# Patient Record
Sex: Male | Born: 1990 | Race: Black or African American | Hispanic: No | Marital: Single | Smoking: Current every day smoker
Health system: Southern US, Community
[De-identification: ages and names within clinical notes are randomized; demographics above are authoritative.]

---
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
## 2013-01-27 ENCOUNTER — Encounter (HOSPITAL_COMMUNITY): Payer: Self-pay

## 2013-01-27 ENCOUNTER — Emergency Department (HOSPITAL_COMMUNITY)
Admission: EM | Admit: 2013-01-27 | Discharge: 2013-01-28 | Disposition: A | Discharge status: Home / Self Care | Payer: BC Managed Care – PPO | Attending: Emergency Medicine | Admitting: Emergency Medicine

## 2013-01-27 DIAGNOSIS — R112 Nausea with vomiting, unspecified: Secondary | ICD-10-CM | POA: Insufficient documentation

## 2013-01-27 DIAGNOSIS — Z87891 Personal history of nicotine dependence: Secondary | ICD-10-CM | POA: Insufficient documentation

## 2013-01-27 NOTE — ED Notes (Addendum)
Pt hasn't eat for 2 days due to lack of money to buy food, EMS CBG was 57 with weakness, shaky, finger numbness, and couldn't stand up.  No medical history, non-diabetic. EMS gave D50 and IM glucogon, CBG now 183, pt appeared more responsive now. Alert, oriented. Vital sign stable. Sinus rhythm on monitor.

## 2013-01-27 NOTE — ED Notes (Signed)
Gave pt. Sandwich, cheese, and apple sauce

## 2013-01-27 NOTE — ED Notes (Signed)
Bed:WA02<BR> Expected date:<BR> Expected time:<BR> Means of arrival:<BR> Comments:<BR> ems

## 2013-01-28 LAB — GLUCOSE, CAPILLARY: Glucose-Capillary: 183 mg/dL — ABNORMAL HIGH (ref 70–99)

## 2013-01-28 MED ORDER — SODIUM CHLORIDE 0.9 % IV BOLUS (SEPSIS)
1000.0000 mL | Freq: Once | INTRAVENOUS | Status: AC
  Administered 2013-01-28: 1000 mL via INTRAVENOUS

## 2013-01-28 MED ORDER — ONDANSETRON HCL 4 MG/2ML IJ SOLN
INTRAMUSCULAR | Status: AC
  Administered 2013-01-28: 4 mg via INTRAVENOUS
  Filled 2013-01-28: qty 2

## 2013-01-28 MED ORDER — ONDANSETRON HCL 4 MG/2ML IJ SOLN
4.0000 mg | Freq: Once | INTRAMUSCULAR | Status: AC
  Administered 2013-01-28: 4 mg via INTRAVENOUS

## 2013-01-28 MED ORDER — METOCLOPRAMIDE HCL 5 MG/ML IJ SOLN
10.0000 mg | Freq: Once | INTRAMUSCULAR | Status: AC
  Administered 2013-01-28: 10 mg via INTRAVENOUS
  Filled 2013-01-28: qty 2

## 2013-01-28 NOTE — ED Provider Notes (Signed)
History     CSN: [id number]  Arrival date & time 01/27/13  2318   First MD Initiated Contact with Patient 01/27/13 2329      Chief Complaint  Patient presents with  . Hypoglycemia    (Consider location/radiation/quality/duration/timing/severity/associated sxs/prior treatment) HPI Comments: Patient states he has not eaten in -3 day because he has no money to but food But did attend a super bowl party and drink ETOH present to the ED with N/V   The history is provided by the patient.    No past medical history on file.  Past Surgical History  Procedure Date  . Esophagogastroduodenoscopy 11/15/2011    Procedure: ESOPHAGOGASTRODUODENOSCOPY (EGD);  Surgeon: Rob Bunting, MD;  Location: [facility] OR;  Service: Gastroenterology;  Laterality: N/A;    No family history on file.  History  Substance Use Topics  . Smoking status: Former Games developer  . Smokeless tobacco: Not on file  . Alcohol Use: Yes      Review of Systems  Constitutional: Negative for fever and chills.  Respiratory: Negative for shortness of breath.   Cardiovascular: Negative for chest pain.  Gastrointestinal: Positive for nausea and vomiting. Negative for abdominal pain and diarrhea.  Skin: Negative for rash and wound.  Neurological: Negative for dizziness and weakness.    Allergies  Review of patient's allergies indicates no known allergies.  Home Medications  No current outpatient prescriptions on file.  BP 125/73  Pulse 75  Temp 97.6 F (36.4 C) (Oral)  Resp 21  SpO2 100%  Physical Exam  Constitutional: He appears well-developed and well-nourished.  HENT:  Head: Normocephalic.  Eyes: Pupils are equal, round, and reactive to light.  Neck: Normal range of motion.  Cardiovascular: Normal rate.   Pulmonary/Chest: Effort normal.  Abdominal: Soft. Bowel sounds are normal. He exhibits no distension. There is no tenderness.  Musculoskeletal: Normal range of motion.  Neurological: He is alert.  Skin: Skin  is warm and dry.    ED Course  Procedures (including critical care time)  Labs Reviewed - No data to display No results found.   1. Nausea & vomiting       MDM   Patient in no acute distress has been give 8 mg of Zofran nut still nauseated  Given Reglan and additional IV fluid         Arman Filter, NP 01/28/13 [phone]

## 2013-01-28 NOTE — ED Notes (Signed)
[id number]<YN> Expected date:<BR> Expected time:<BR> Means of arrival:<BR> Comments:<BR> Hold for room 2

## 2013-01-28 NOTE — ED Notes (Signed)
PO trail. Water given.

## 2013-01-28 NOTE — ED Provider Notes (Signed)
Medical screening examination/treatment/procedure(s) were performed by non-physician practitioner and as supervising physician I was immediately available for consultation/collaboration.   Anelise Staron M Bethel Gaglio, DO 01/28/13 1807 

## 2013-01-28 NOTE — ED Notes (Signed)
Pt tolerated water well. Crackers given at this time

## 2013-07-12 ENCOUNTER — Encounter (HOSPITAL_COMMUNITY): Payer: Self-pay

## 2013-07-12 ENCOUNTER — Emergency Department (HOSPITAL_COMMUNITY): Payer: BC Managed Care – PPO

## 2013-07-12 ENCOUNTER — Emergency Department (HOSPITAL_COMMUNITY)
Admission: EM | Admit: 2013-07-12 | Discharge: 2013-07-12 | Disposition: A | Discharge status: Home / Self Care | Payer: BC Managed Care – PPO | Attending: Emergency Medicine | Admitting: Emergency Medicine

## 2013-07-12 DIAGNOSIS — Z87891 Personal history of nicotine dependence: Secondary | ICD-10-CM | POA: Insufficient documentation

## 2013-07-12 DIAGNOSIS — W230XXA Caught, crushed, jammed, or pinched between moving objects, initial encounter: Secondary | ICD-10-CM | POA: Insufficient documentation

## 2013-07-12 DIAGNOSIS — Y939 Activity, unspecified: Secondary | ICD-10-CM | POA: Insufficient documentation

## 2013-07-12 DIAGNOSIS — Y990 Civilian activity done for income or pay: Secondary | ICD-10-CM | POA: Insufficient documentation

## 2013-07-12 DIAGNOSIS — S8990XA Unspecified injury of unspecified lower leg, initial encounter: Secondary | ICD-10-CM | POA: Insufficient documentation

## 2013-07-12 DIAGNOSIS — M79672 Pain in left foot: Secondary | ICD-10-CM

## 2013-07-12 DIAGNOSIS — Y929 Unspecified place or not applicable: Secondary | ICD-10-CM | POA: Insufficient documentation

## 2013-07-12 MED ORDER — HYDROCODONE-ACETAMINOPHEN 5-325 MG PO TABS: 1.0000 | ORAL_TABLET | ORAL | Status: DC | PRN

## 2013-07-12 MED ORDER — HYDROCODONE-ACETAMINOPHEN 5-325 MG PO TABS
2.0000 | ORAL_TABLET | Freq: Once | ORAL | Status: AC
  Administered 2013-07-12: 2 via ORAL
  Filled 2013-07-12: qty 2

## 2013-07-12 NOTE — ED Notes (Signed)
Pt got left big toe caught in hydraulic lift at work.

## 2013-07-12 NOTE — ED Provider Notes (Signed)
Medical screening examination/treatment/procedure(s) were performed by non-physician practitioner and as supervising physician I was immediately available for consultation/collaboration.  Schuyler Behan K Oney Tatlock-Rasch, MD 07/12/13 2343 

## 2013-07-12 NOTE — ED Provider Notes (Signed)
   History    CSN: [id number] Arrival date & time 07/12/13  0202  First MD Initiated Contact with Patient 07/12/13 0244     Chief Complaint  Patient presents with  . Toe Injury   (Consider location/radiation/quality/duration/timing/severity/associated sxs/prior Treatment) HPI History provided by pt.   Pt reports that his let foot was caught in a hydraulic lift at [employer] at [employer] yesterday, and he has had gradually worsening pain in right great toe and 2nd toe ever since.  Unable to bear weight.  Has not taken anything for pain.  No associated skin changes or paresthesias.  History reviewed. No pertinent past medical history. Past Surgical History  Procedure Laterality Date  . Esophagogastroduodenoscopy  11/15/2011    Procedure: ESOPHAGOGASTRODUODENOSCOPY (EGD);  Surgeon: Rob Bunting, MD;  Location: [facility] OR;  Service: Gastroenterology;  Laterality: N/A;   History reviewed. No pertinent family history. History  Substance Use Topics  . Smoking status: Former Games developer  . Smokeless tobacco: Not on file  . Alcohol Use: Yes     Comment: social    Review of Systems  All other systems reviewed and are negative.    Allergies  Review of patient's allergies indicates no known allergies.  Home Medications   Current Outpatient Rx  Name  Route  Sig  Dispense  Refill  . HYDROcodone-acetaminophen (NORCO/VICODIN) 5-325 MG per tablet   Oral   Take 1 tablet by mouth every 4 (four) hours as needed for pain.   15 tablet   0    BP 134/77  Pulse 77  Temp(Src) 98.2 F (36.8 C) (Oral)  Resp 18  SpO2 100% Physical Exam  Nursing note and vitals reviewed. Constitutional: He is oriented to person, place, and time. He appears well-developed and well-nourished. No distress.  HENT:  Head: Normocephalic and atraumatic.  Eyes:  Normal appearance  Neck: Normal range of motion.  Pulmonary/Chest: Effort normal.  Musculoskeletal: Normal range of motion.  L foot w/out deformity, edema, ecchymosis  or abrasion.  Severe tenderness base of 1st metacarpal and diffuse great and second toes.  Pain w/ minimal passive flexion joints of these two toes.  Distal sensation intact.    Neurological: He is alert and oriented to person, place, and time.  Psychiatric: He has a normal mood and affect. His behavior is normal.    ED Course  Procedures (including critical care time) Labs Reviewed - No data to display Dg Foot Complete Left  07/12/2013   *RADIOLOGY REPORT*  Clinical Data: Crash injury to the great toe.  LEFT FOOT - COMPLETE 3+ VIEW  Comparison: None.  Findings: The lateral great toe sesamoid has two parts with a mildly irregular margin in between.  No malalignment.  No radiodense foreign body.  IMPRESSION:  1.  Two-part lateral great toe sesamoid, often developmental, but occasionally post traumatic.  Correlate with point tenderness. 2.  Otherwise, negative left foot.   Original Report Authenticated By: Tiburcio Pea   1. Pain in left foot     MDM  Healthy Irena Reichmann M presents w/ right foot injury.  Xray shows sesamoid bones of great toe that could potentially be post-traumatic.  Images shared w/ patient. There is point tenderness in this location.  Cam walker provided by nursing staff and 2 vicodin ordered for pain.  D/c'd home w/ same + referral to ortho.  Recommended RICE at home as well.  Return precautions discussed.   Otilio Miu, PA-C 07/12/13 [phone]

## 2015-05-19 ENCOUNTER — Emergency Department (HOSPITAL_COMMUNITY): Payer: BLUE CROSS/BLUE SHIELD

## 2015-05-19 ENCOUNTER — Encounter (HOSPITAL_COMMUNITY): Payer: Self-pay | Admitting: Emergency Medicine

## 2015-05-19 ENCOUNTER — Emergency Department (HOSPITAL_COMMUNITY)
Admission: EM | Admit: 2015-05-19 | Discharge: 2015-05-19 | Disposition: A | Discharge status: Home / Self Care | Payer: BLUE CROSS/BLUE SHIELD | Attending: Emergency Medicine | Admitting: Emergency Medicine

## 2015-05-19 DIAGNOSIS — Y998 Other external cause status: Secondary | ICD-10-CM | POA: Diagnosis not present

## 2015-05-19 DIAGNOSIS — S62316A Displaced fracture of base of fifth metacarpal bone, right hand, initial encounter for closed fracture: Secondary | ICD-10-CM | POA: Insufficient documentation

## 2015-05-19 DIAGNOSIS — Y9389 Activity, other specified: Secondary | ICD-10-CM | POA: Insufficient documentation

## 2015-05-19 DIAGNOSIS — Y9289 Other specified places as the place of occurrence of the external cause: Secondary | ICD-10-CM | POA: Diagnosis not present

## 2015-05-19 DIAGNOSIS — W2201XA Walked into wall, initial encounter: Secondary | ICD-10-CM | POA: Insufficient documentation

## 2015-05-19 DIAGNOSIS — S62306A Unspecified fracture of fifth metacarpal bone, right hand, initial encounter for closed fracture: Secondary | ICD-10-CM

## 2015-05-19 DIAGNOSIS — Z87891 Personal history of nicotine dependence: Secondary | ICD-10-CM | POA: Diagnosis not present

## 2015-05-19 DIAGNOSIS — S6991XA Unspecified injury of right wrist, hand and finger(s), initial encounter: Secondary | ICD-10-CM | POA: Diagnosis present

## 2015-05-19 MED ORDER — NAPROXEN 375 MG PO TABS: 375.0000 mg | ORAL_TABLET | Freq: Two times a day (BID) | ORAL | Status: DC

## 2015-05-19 MED ORDER — TETANUS-DIPHTH-ACELL PERTUSSIS 5-2.5-18.5 LF-MCG/0.5 IM SUSP
0.5000 mL | Freq: Once | INTRAMUSCULAR | Status: DC
  Filled 2015-05-19: qty 0.5

## 2015-05-19 MED ORDER — KETOROLAC TROMETHAMINE 60 MG/2ML IM SOLN
60.0000 mg | Freq: Once | INTRAMUSCULAR | Status: AC
Start: 2015-05-19 — End: 2015-05-19
  Administered 2015-05-19: 60 mg via INTRAMUSCULAR
  Filled 2015-05-19: qty 2

## 2015-05-19 MED ORDER — HYDROCODONE-ACETAMINOPHEN 5-325 MG PO TABS: 1.0000 | ORAL_TABLET | Freq: Four times a day (QID) | ORAL | Status: DC | PRN

## 2015-05-19 NOTE — ED Notes (Signed)
Wound care completed on exposed fingers. Applied gauze.

## 2015-05-19 NOTE — ED Notes (Signed)
Patient here with complaint of right hand pain secondary to punching a wall. Right hand movement intact, swelling in lateral aspect of hand extending into wrist.

## 2015-05-19 NOTE — Discharge Instructions (Signed)
Boxer's Fracture Boxer's fracture is a broken bone (fracture) of the fourth or fifth metacarpal (ring or pinky finger). The metacarpal bones connect the wrist to the fingers and make up the arch of the hand. Boxer's fracture occurs toward the body (proximal) from the first knuckle. This injury is known as a boxer's fracture, because it often occurs from hitting an object with a closed fist. SYMPTOMS   Severe pain at the time of injury.  Pain and swelling around the first knuckle on the fourth or fifth finger.  Bruising (contusion) in the area within 48 hours of injury.  Visible deformity, such as a pushed down knuckle. This can occur if the fracture is complete, and the bone fragments separate enough to distort normal body shape.  Numbness or paralysis from swelling in the hand, causing pressure on the blood vessels or nerves (uncommon). CAUSES   Direct injury (trauma), such as a striking blow with the fist.  Indirect stress to the hand, such as twisting or violent muscle contraction (uncommon). RISK INCREASES WITH:  Punching an object with an unprotected knuckle.  Contact sports (football, rugby).  Sports that require hitting (boxing, martial arts).  History of bone or joint disease (osteoporosis). PREVENTION  Maintain physical fitness:  Muscle strength and flexibility.  Endurance.  Cardiovascular fitness.  For participation in contact sports, wear proper protective equipment for the hand and make sure it fits properly.  Learn and use proper technique when hitting or punching. PROGNOSIS  When proper treatment is given, to ensure normal alignment of the bones, healing can usually be expected in 4 to 6 weeks. Occasionally, surgery is necessary.  RELATED COMPLICATIONS   Bone does not heal back together (nonunion).  Bone heals together in an improper position (malunion), causing twisting of the finger when making a fist.  Chronic pain, stiffness, or swelling of the  hand.  Excessive bleeding in the hand, causing pressure and injury to nerves and blood vessels (rare).  Stopping of normal hand growth in children.  Infection of the wound, if skin is broken over the fracture (open fracture), or at the incision site if surgery is performed.  Shortening of injured bones.  Bony bump (prominence) in the palm or loss of shape of the knuckles.  Pain and weakness when gripping.  Arthritis of the affected joint, if the fracture goes into the joint, after repeated injury, or after delayed treatment.  Scarring around the knuckle, and limited motion. TREATMENT  Treatment varies, depending on the injury. The place in the hand where the injury occurs has a great deal of motion, which allows the hand to move properly. If the fracture is not aligned properly, this function may be decreased. If the bone ends are in proper alignment, treatment first involves ice and elevation of the injured hand, at or above heart level, to reduce inflammation. Pain medicines help to relieve pain. Treatment also involves restraint by splinting, bandaging, casting, or bracing for 4 or more weeks.  If the fracture is out of alignment (displaced), or it involves the joint, surgery is usually recommended. Surgery typically involves cutting through the skin to place removable pins, screws, and sometimes plates over the fracture. After surgery, the bone and joint are restrained for 4 or more weeks. After restraint (with or without surgery), stretching and strengthening exercises are needed to regain proper strength and function of the joint. Exercises may be done at home or with the assistance of a therapist. Depending on the sport and position played, a  brace or splint may be recommended when first returning to sports.  MEDICATION   If pain medication is necessary, nonsteroidal anti-inflammatory medications, such as aspirin and ibuprofen, or other minor pain relievers, such as acetaminophen, are  often recommended.  Do not take pain medication for 7 days before surgery.  Prescription pain relievers may be necessary. Use only as directed and only as much as you need. COLD THERAPY Cold treatment (icing) relieves pain and reduces inflammation. Cold treatment should be applied for 10 to 15 minutes every 2 to 3 hours for inflammation and pain, and immediately after any activity that aggravates your symptoms. Use ice packs or an ice massage. SEEK MEDICAL CARE IF:   Pain, tenderness, or swelling gets worse, despite treatment.  You experience pain, numbness, or coldness in the hand.  Blue, gray, or dark color appears in the fingernails.  You develop signs of infection, after surgery (fever, increased pain, swelling, redness, drainage of fluids, or bleeding in the surgical area).  You feel you have reinjured the hand.  New, unexplained symptoms develop. (Drugs used in treatment may produce side effects.) Document Released: 12/12/2005 Document Revised: 03/05/2012 Document Reviewed: 03/26/2009 [facility] Center[facility] Patient Information 2015 [place]ExitCare, [place]. This information is not intended to replace advice given to you by your health care provider. Make sure you discuss any questions you have with your health care provider.  Cast Care The purpose of a cast is to protect and immobilize an injured part of the body. This may be necessary after fractures, surgery, or other injuries. Splints are another form of immobilization; however, they are usually not as rigid as a cast, which accommodates swelling of the injury while still maintaining immobilization. Splints are typically used in the immediate post injury or postoperative period, before changing to a cast.  Before the rigid material of a cast is formed, a layer of padding is first applied to protect the injury. The rigid portion of a cast is created by wrapping gauze saturated with plaster of par[place] around the injury; alternatively, the cast may be made of  fiberglass. During the period of immobilization, a cast may need to be changed multiple times. The length of immobilization is dependent on the severity of the injury and the time needed for healing. This period can vary from a couple weeks to many months. After a cast is created, radiographs (X-rays) through the cast determine if a satisfactory alignment of the bones was achieved. Radiographs may also be used throughout the healing period to check for signs of bone healing.  CARE OF THE CAST   Allow the cast to dry and harden completely before applying any pressure to it.  Applying pressure too early may create a point of excessive pressure on the skin, which may increase the risk of forming an ulcer. Drying time depends on the type of cast but may last as long as 24 hours.  When a cast gets wet, a soft area may appear. If this happens accidentally, return to the doctor's office, emergency room, or outpatient surgical facility as soon as possible for repairs or changing of the cast.  Do not get sand in the cast.  Do not place anything inside the cast. This includes items intended to scratch an area of skin that itches. ITCHING INSIDE A CAST   It is very common for a person with a cast to experience an itching sensation under the cast.  Do not scratch any area under the cast even if the area is within  reach. The skin under a cast in an environment of increased risk for injury. °· Do not put anything in the cast. °· If there is no wound, such as an incision from surgery, you may sprinkle cornstarch into the cast to relieve itching. °· If a wound is present under the cast, consult your caregiver for pain medication or medication to reduce itching. °· Using a hairdryer (on the cold setting) is a useful technique for reducing an itching sensation. °CARE OF THE PATIENT IN A CAST °It is important to elevate the body part that is in a cast to a level equal to or above that of the heart whenever possible.  Elevating the injured body part may reduce the likelihood of swelling. Elevation of a leg in a cast may be achieved by resting the leg on a pillow when in bed and on a footstool or chair when sitting. For an arm in a cast, rest the arm on a pillow placed on the chest.  °No matter how well one follows the necessary precautions, excessive swelling may occur under the cast. Signs and symptoms of excessive swelling include: °· Severe and persistent pain. °· Change in color of the tissues beyond the end of the cast, such as a change to blue or gray under the nails of the fingers or toes. °· Coldness of the tissues beyond the cast when the rest of the body is warm. °· Numbness or complete loss of feeling in the skin beyond the cast. °· Feeling of tightness under the cast after it dries. °· Swelling of the tissue to a greater extent than was present before the cast was applied. °· For a leg cast, inability to raise the big toe. °If any of these signs or symptoms occur, contact your caregiver or an emergency room as soon as possible for treatment.  °Infection Inside a Cast °On occasion, an injury may become infected during healing. The most important way to fight an infection is to detect it early; however, early detection may be difficult if the infected area is covered by a cast. Infection should be reported immediately to your caregiver. The following are common signs and symptoms of infection:  °· Foul smell. °· Fever greater than 101°F (38.3°C) (may be accompanied by a general ill feeling). °· Leakage of fluid through the cast. °· Increasing pain or soreness of the skin under the cast. °Bathing with a Cast °Bathing is often a difficult task with a cast. The cast must be kept dry at all times, unless otherwise specified by your caregiver. If the cast is on a limb, such as your arm or leg, it is often easier to take a bath with the extremity in a cast propped up on the side of the tub or a chair, out of the water. If the  cast is on the trunk of the body, you should take sponge baths until the cast is removed.  °Document Released: 12/12/2005 Document Revised: 04/28/2014 Document Reviewed: 03/26/2009 °ExitCare® Patient Information ©2015 [employer]. This information is not intended to replace advice given to you by your health care provider. Make sure you discuss any questions you have with your health care provider. ° °

## 2015-05-19 NOTE — ED Notes (Signed)
Explained to patient no food or drink until xray results available. Patient acknowledges, ambulates to restroom unassisted.

## 2015-05-19 NOTE — ED Notes (Signed)
Explained need for tetanus shot, and risk of not taking it. Patient still refuses at this time. Discussed pain medication. Splint in place on right upper extremity.

## 2015-05-19 NOTE — ED Provider Notes (Addendum)
CSN: [id number]     Arrival date & time 05/19/15  0344 History   First MD Initiated Contact with Patient 05/19/15 0503     Chief Complaint  Patient presents with  . Hand Pain     (Consider location/radiation/quality/duration/timing/severity/associated sxs/prior Treatment) Patient is a 24 y.o. male presenting with hand pain. The history is provided by the patient.  Hand Pain This is a new problem. The current episode started 3 to 5 hours ago. The problem occurs constantly. The problem has not changed since onset.Pertinent negatives include no chest pain and no abdominal pain. Nothing aggravates the symptoms. Nothing relieves the symptoms. He has tried nothing for the symptoms. The treatment provided no relief.  punched a wall with right hand while drinking.  No associated trauma.  Tetanus unknown  History reviewed. No pertinent past medical history. Past Surgical History  Procedure Laterality Date  . Esophagogastroduodenoscopy  11/15/2011    Procedure: ESOPHAGOGASTRODUODENOSCOPY (EGD);  Surgeon: Rob Bunting, MD;  Location: [facility];  Service: Gastroenterology;  Laterality: N/A;   History reviewed. No pertinent family history. History  Substance Use Topics  . Smoking status: Former Games developer  . Smokeless tobacco: Not on file  . Alcohol Use: Yes     Comment: social    Review of Systems  Constitutional: Negative for fever.  Cardiovascular: Negative for chest pain.  Gastrointestinal: Negative for abdominal pain.  All other systems reviewed and are negative.     Allergies  Review of patient's allergies indicates no known allergies.  Home Medications   Prior to Admission medications   Medication Sig Start Date End Date Taking? Authorizing Provider  HYDROcodone-acetaminophen (NORCO/VICODIN) 5-325 MG per tablet Take 1 tablet by mouth every 4 (four) hours as needed for pain. Patient not taking: Reported on 05/19/2015 07/12/13   Ruby Cola, PA-C   BP 136/96 mmHg  Pulse 99   Temp(Src) 98.2 F (36.8 C) (Oral)  SpO2 100% Physical Exam  Constitutional: He is oriented to person, place, and time. He appears well-developed and well-nourished. No distress.  HENT:  Head: Normocephalic and atraumatic.  Right Ear: External ear normal.  Left Ear: External ear normal.  Mouth/Throat: Oropharynx is clear and moist.  Eyes: Conjunctivae and EOM are normal. Pupils are equal, round, and reactive to light.  Neck: Normal range of motion. Neck supple.  Cardiovascular: Normal rate, regular rhythm and intact distal pulses.   Pulmonary/Chest: Effort normal and breath sounds normal. No respiratory distress. He has no wheezes. He has no rales.  Abdominal: Soft. Bowel sounds are normal. There is no tenderness. There is no rebound and no guarding.  Musculoskeletal: Normal range of motion. He exhibits tenderness.       Right wrist: Normal. He exhibits normal range of motion, no tenderness, no bony tenderness, no swelling, no effusion, no crepitus, no deformity and no laceration.       Right hand: He exhibits deformity. He exhibits normal range of motion, normal two-point discrimination and normal capillary refill. Normal sensation noted. Decreased sensation is not present in the ulnar distribution, is not present in the medial distribution and is not present in the radial distribution. Normal strength noted.       Hands: Neurological: He is alert and oriented to person, place, and time. He has normal reflexes.  Skin: Skin is warm and dry.  Psychiatric: He has a normal mood and affect.    ED Course  Procedures (including critical care time) Labs Review Labs Reviewed - No data to display  Imaging  Review Dg Hand Complete Right  05/19/2015   CLINICAL DATA:  Punched a wall with right hand pain and laceration. Initial encounter.  EXAM: RIGHT HAND - COMPLETE 3+ VIEW  COMPARISON:  None.  FINDINGS: There is an intra-articular fracture of the fifth metacarpal base with fracture widening of 5 mm  along the articular surface. There is a small avulsion fracture from the hamate or metacarpal at the level of the [facility]. No additional injury noted.  IMPRESSION: Displaced fifth metacarpal base fracture.   Electronically Signed   By: Marnee SpringJonathon  Watts M.D.   On: 05/19/2015 04:38     EKG Interpretation None      MDM   Final diagnoses:  None    Splinting pain medication and close follow up with hand surgery.  Patient verbalizes understanding and agrees to follow up    Sonnie Pawloski, MD 05/19/15 [id number]  Cy BlamerApril Bahja Bence, MD 05/19/15 [id number]

## 2015-12-07 ENCOUNTER — Emergency Department (HOSPITAL_COMMUNITY)
Admission: EM | Admit: 2015-12-07 | Discharge: 2015-12-07 | Disposition: A | Discharge status: Home / Self Care | Payer: BLUE CROSS/BLUE SHIELD | Attending: Emergency Medicine | Admitting: Emergency Medicine

## 2015-12-07 ENCOUNTER — Encounter (HOSPITAL_COMMUNITY): Payer: Self-pay

## 2015-12-07 ENCOUNTER — Emergency Department (HOSPITAL_COMMUNITY): Payer: BLUE CROSS/BLUE SHIELD

## 2015-12-07 DIAGNOSIS — Z791 Long term (current) use of non-steroidal anti-inflammatories (NSAID): Secondary | ICD-10-CM | POA: Diagnosis not present

## 2015-12-07 DIAGNOSIS — Z87891 Personal history of nicotine dependence: Secondary | ICD-10-CM | POA: Insufficient documentation

## 2015-12-07 DIAGNOSIS — K5900 Constipation, unspecified: Secondary | ICD-10-CM | POA: Insufficient documentation

## 2015-12-07 DIAGNOSIS — R1030 Lower abdominal pain, unspecified: Secondary | ICD-10-CM

## 2015-12-07 DIAGNOSIS — R1032 Left lower quadrant pain: Secondary | ICD-10-CM | POA: Diagnosis present

## 2015-12-07 LAB — COMPREHENSIVE METABOLIC PANEL
ALBUMIN: 3.9 g/dL (ref 3.5–5.0)
ALK PHOS: 69 U/L (ref 38–126)
ALT: 21 U/L (ref 17–63)
ANION GAP: 7 (ref 5–15)
AST: 20 U/L (ref 15–41)
BUN: 8 mg/dL (ref 6–20)
CALCIUM: 9.5 mg/dL (ref 8.9–10.3)
CO2: 29 mmol/L (ref 22–32)
Chloride: 101 mmol/L (ref 101–111)
Creatinine, Ser: 1.32 mg/dL — ABNORMAL HIGH (ref 0.61–1.24)
GFR calc Af Amer: >60 mL/min (ref >60)
GFR calc non Af Amer: >60 mL/min (ref >60)
GLUCOSE: 106 mg/dL — AB (ref 65–99)
Potassium: 4.1 mmol/L (ref 3.5–5.1)
SODIUM: 137 mmol/L (ref 135–145)
Total Bilirubin: 0.7 mg/dL (ref 0.3–1.2)
Total Protein: 6.9 g/dL (ref 6.5–8.1)

## 2015-12-07 LAB — URINALYSIS, ROUTINE W REFLEX MICROSCOPIC
BILIRUBIN URINE: NEGATIVE
Glucose, UA: NEGATIVE mg/dL
HGB URINE DIPSTICK: NEGATIVE
Ketones, ur: NEGATIVE mg/dL
Leukocytes, UA: NEGATIVE
Nitrite: NEGATIVE
PH: 6.5 (ref 5.0–8.0)
Protein, ur: NEGATIVE mg/dL
SPECIFIC GRAVITY, URINE: 1.023 (ref 1.005–1.030)

## 2015-12-07 LAB — CBC
HCT: 44.2 % (ref 39.0–52.0)
HEMOGLOBIN: 15.1 g/dL (ref 13.0–17.0)
MCH: 28.1 pg (ref 26.0–34.0)
MCHC: 34.2 g/dL (ref 30.0–36.0)
MCV: 82.3 fL (ref 78.0–100.0)
Platelets: 197 10*3/uL (ref 150–400)
RBC: 5.37 MIL/uL (ref 4.22–5.81)
RDW: 12.3 % (ref 11.5–15.5)
WBC: 9.5 10*3/uL (ref 4.0–10.5)

## 2015-12-07 LAB — LIPASE, BLOOD: Lipase: 24 U/L (ref 11–51)

## 2015-12-07 MED ORDER — DICYCLOMINE HCL 20 MG PO TABS: 20.0000 mg | ORAL_TABLET | Freq: Two times a day (BID) | ORAL | Status: DC

## 2015-12-07 MED ORDER — DICYCLOMINE HCL 10 MG PO CAPS
10.0000 mg | ORAL_CAPSULE | Freq: Once | ORAL | Status: AC
  Administered 2015-12-07: 10 mg via ORAL
  Filled 2015-12-07: qty 1

## 2015-12-07 MED ORDER — POLYETHYLENE GLYCOL 3350 17 G PO PACK: 17.0000 g | PACK | Freq: Three times a day (TID) | ORAL | Status: DC

## 2015-12-07 NOTE — ED Provider Notes (Signed)
CSN: [id number]     Arrival date & time 12/07/15  0211 History   First MD Initiated Contact with Patient 12/07/15 0243     Chief Complaint  Patient presents with  . Abdominal Pain     (Consider location/radiation/quality/duration/timing/severity/associated sxs/prior Treatment) Patient is a 24 y.o. male presenting with abdominal pain. The history is provided by the patient. No language interpreter was used.  Abdominal Pain Pain location:  LLQ, RLQ and suprapubic Pain quality: aching and cramping   Pain radiates to:  Does not radiate Pain severity:  Moderate Onset quality:  Gradual Duration:  1 day Timing:  Constant Chronicity:  New Associated symptoms: no chills, no diarrhea, no dysuria, no fever, no nausea and no vomiting   Associated symptoms comment:  Patient with no other medical history presents for lower abdominal discomfort for the past one day. No fever, nausea, vomiting or diarrhea. He reports a BM yesterday and feeling like he needed to have a bowel movement tonight after prune juice but couldn't go. No groin pain, urinary symptoms or back/flank pain.   History reviewed. No pertinent past medical history. Past Surgical History  Procedure Laterality Date  . Esophagogastroduodenoscopy  11/15/2011    Procedure: ESOPHAGOGASTRODUODENOSCOPY (EGD);  Surgeon: Rob Buntinganiel Jacobs, MD;  [facility] OR;  Service: Gastroenterology;  Laterality: N/A;   No family history on file. Social History  Substance Use Topics  . Smoking status: Former Games developermoker  . Smokeless tobacco: None  . Alcohol Use: Yes     Comment: social    Review of Systems  Constitutional: Negative for fever and chills.  Respiratory: Negative.   Cardiovascular: Negative.   Gastrointestinal: Positive for abdominal pain. Negative for nausea, vomiting and diarrhea.  Genitourinary: Negative.  Negative for dysuria, scrotal swelling and testicular pain.  Musculoskeletal: Negative.  Negative for myalgias.  Neurological:  Negative.       Allergies  Review of patient's allergies indicates no known allergies.  Home Medications   Prior to Admission medications   Medication Sig Start Date End Date Taking? Authorizing Provider  HYDROcodone-acetaminophen (NORCO) 5-325 MG per tablet Take 1 tablet by mouth every 6 (six) hours as needed. 05/19/15   April Palumbo, MD  HYDROcodone-acetaminophen (NORCO/VICODIN) 5-325 MG per tablet Take 1 tablet by mouth every 4 (four) hours as needed for pain. Patient not taking: Reported on 05/19/2015 07/12/13   Ruby Colaatherine Schinlever, PA-C  naproxen (NAPROSYN) 375 MG tablet Take 1 tablet (375 mg total) by mouth 2 (two) times daily. 05/19/15   April Palumbo, MD   BP 113/75 mmHg  Pulse 84  Temp(Src) 98.1 F (36.7 C) (Oral)  Resp 20  SpO2 100% Physical Exam  Constitutional: He appears well-developed and well-nourished.  HENT:  Head: Normocephalic.  Neck: Normal range of motion. Neck supple.  Cardiovascular: Normal rate and regular rhythm.   Pulmonary/Chest: Effort normal and breath sounds normal.  Abdominal: Soft. Bowel sounds are normal. There is tenderness. There is no rebound and no guarding.  Tender across soft, lower abdomen without guarding. No upper abdominal tenderness.   Musculoskeletal: Normal range of motion.  Neurological: He is alert. No cranial nerve deficit.  Skin: Skin is warm and dry. No rash noted.  Psychiatric: He has a normal mood and affect.    ED Course  Procedures (including critical care time) Labs Review Labs Reviewed  COMPREHENSIVE METABOLIC PANEL - Abnormal; Notable for the following:    Glucose, Bld 106 (*)    Creatinine, Ser 1.32 (*)    All other components  within normal limits  LIPASE, BLOOD  CBC  URINALYSIS, ROUTINE W REFLEX MICROSCOPIC (NOT AT [facility])   Results for orders placed or performed during the hospital encounter of 12/07/15  Lipase, blood  Result Value Ref Range   Lipase 24 11 - 51 U/L  Comprehensive metabolic panel  Result  Value Ref Range   Sodium 137 135 - 145 mmol/L   Potassium 4.1 3.5 - 5.1 mmol/L   Chloride 101 101 - 111 mmol/L   CO2 29 22 - 32 mmol/L   Glucose, Bld 106 (H) 65 - 99 mg/dL   BUN 8 6 - 20 mg/dL   Creatinine, Ser 5.62 (H) 0.61 - 1.24 mg/dL   Calcium 9.5 8.9 - 13.0 mg/dL   Total Protein 6.9 6.5 - 8.1 g/dL   Albumin 3.9 3.5 - 5.0 g/dL   AST 20 15 - 41 U/L   ALT 21 17 - 63 U/L   Alkaline Phosphatase 69 38 - 126 U/L   Total Bilirubin 0.7 0.3 - 1.2 mg/dL   GFR calc non Af Amer >60 >60 mL/min   GFR calc Af Amer >60 >60 mL/min   Anion gap 7 5 - 15  CBC  Result Value Ref Range   WBC 9.5 4.0 - 10.5 K/uL   RBC 5.37 4.22 - 5.81 MIL/uL   Hemoglobin 15.1 13.0 - 17.0 g/dL   HCT 86.5 78.4 - 69.6 %   MCV 82.3 78.0 - 100.0 fL   MCH 28.1 26.0 - 34.0 pg   MCHC 34.2 30.0 - 36.0 g/dL   RDW 29.5 28.4 - 13.2 %   Platelets 197 150 - 400 K/uL  Urinalysis, Routine w reflex microscopic (not at [facility])  Result Value Ref Range   Color, Urine YELLOW YELLOW   APPearance CLEAR CLEAR   Specific Gravity, Urine 1.023 1.005 - 1.030   pH 6.5 5.0 - 8.0   Glucose, UA NEGATIVE NEGATIVE mg/dL   Hgb urine dipstick NEGATIVE NEGATIVE   Bilirubin Urine NEGATIVE NEGATIVE   Ketones, ur NEGATIVE NEGATIVE mg/dL   Protein, ur NEGATIVE NEGATIVE mg/dL   Nitrite NEGATIVE NEGATIVE   Leukocytes, UA NEGATIVE NEGATIVE   Dg Abd 1 View  12/07/2015  CLINICAL DATA:  Abdominal pain since yesterday. Last bowel movement yesterday. EXAM: ABDOMEN - 1 VIEW COMPARISON:  None. FINDINGS: Gas and stool scattered throughout the colon. No small or large bowel distention. No radiopaque stones. Visualized bones appear intact. IMPRESSION: Nonobstructive bowel gas pattern. Electronically Signed   By: Burman Nieves M.D.   On: 12/07/2015 03:20    Imaging Review No results found. I have personally reviewed and evaluated these images and lab results as part of my medical decision-making.   EKG Interpretation None      MDM   Final  diagnoses:  None    1. Abdominal pain 2. Constipation  The patient presents with one day lower abdominal pain without symptoms of infection - no fever, nausea, vomiting. He is otherwise healthy. No leukocytosis. Insignificant elevation to creatinine. Suspect constipation and recommend treatment with Miralax. Strict return precautions discussed with patient and mother.     Elpidio Anis, PA-C 12/08/15 0431  Dione Booze, MD 12/08/15 [phone]

## 2015-12-07 NOTE — ED Notes (Signed)
Patient verbalized understanding of discharge instructions and denies any further needs or questions at this time. VS stable, patient ambulatory with steady gait. 

## 2015-12-07 NOTE — Discharge Instructions (Signed)
Abdominal Pain, Adult °Many things can cause abdominal pain. Usually, abdominal pain is not caused by a disease and will improve without treatment. It can often be observed and treated at home. Your health care provider will do a physical exam and possibly order blood tests and X-rays to help determine the seriousness of your pain. However, in many cases, more time must pass before a clear cause of the pain can be found. Before that point, your health care provider may not know if you need more testing or further treatment. °HOME CARE INSTRUCTIONS °Monitor your abdominal pain for any changes. The following actions may help to alleviate any discomfort you are experiencing: °· Only take over-the-counter or prescription medicines as directed by your health care provider. °· Do not take laxatives unless directed to do so by your health care provider. °· Try a clear liquid diet (broth, tea, or water) as directed by your health care provider. Slowly move to a bland diet as tolerated. °SEEK MEDICAL CARE IF: °· You have unexplained abdominal pain. °· You have abdominal pain associated with nausea or diarrhea. °· You have pain when you urinate or have a bowel movement. °· You experience abdominal pain that wakes you in the night. °· You have abdominal pain that is worsened or improved by eating food. °· You have abdominal pain that is worsened with eating fatty foods. °· You have a fever. °SEEK IMMEDIATE MEDICAL CARE IF: °· Your pain does not go away within 2 hours. °· You keep throwing up (vomiting). °· Your pain is felt only in portions of the abdomen, such as the right side or the left lower portion of the abdomen. °· You pass bloody or black tarry stools. °MAKE SURE YOU: °· Understand these instructions. °· Will watch your condition. °· Will get help right away if you are not doing well or get worse. °  °This information is not intended to replace advice given to you by your health care provider. Make sure you discuss  any questions you have with your health care provider. °  °Document Released: 09/21/2005 Document Revised: 09/02/2015 Document Reviewed: 08/21/2013 °Elsevier Interactive Patient Education ©2016 [employer] ° °Constipation, Adult °Constipation is when a person has fewer than three bowel movements a week, has difficulty having a bowel movement, or has stools that are dry, hard, or larger than normal. As people grow older, constipation is more common. A low-fiber diet, not taking in enough fluids, and taking certain medicines may make constipation worse.  °CAUSES  °· Certain medicines, such as antidepressants, pain medicine, iron supplements, antacids, and water pills.   °· Certain diseases, such as diabetes, irritable bowel syndrome (IBS), thyroid disease, or depression.   °· Not drinking enough water.   °· Not eating enough fiber-rich foods.   °· Stress or travel.   °· Lack of physical activity or exercise.   °· Ignoring the urge to have a bowel movement.   °· Using laxatives too much.   °SIGNS AND SYMPTOMS  °· Having fewer than three bowel movements a week.   °· Straining to have a bowel movement.   °· Having stools that are hard, dry, or larger than normal.   °· Feeling full or bloated.   °· Pain in the lower abdomen.   °· Not feeling relief after having a bowel movement.   °DIAGNOSIS  °Your health care provider will take a medical history and perform a physical exam. Further testing may be done for severe constipation. Some tests may include: °· A barium enema X-ray to examine your rectum, colon, and, sometimes,   your small intestine.   °· A sigmoidoscopy to examine your lower colon.   °· A colonoscopy to examine your entire colon. °TREATMENT  °Treatment will depend on the severity of your constipation and what is causing it. Some dietary treatments include drinking more fluids and eating more fiber-rich foods. Lifestyle treatments may include regular exercise. If these diet and lifestyle recommendations do not  help, your health care provider may recommend taking over-the-counter laxative medicines to help you have bowel movements. Prescription medicines may be prescribed if over-the-counter medicines do not work.  °HOME CARE INSTRUCTIONS  °· Eat foods that have a lot of fiber, such as fruits, vegetables, whole grains, and beans. °· Limit foods high in fat and processed sugars, such as french fries, hamburgers, cookies, candies, and soda.   °· A fiber supplement may be added to your diet if you cannot get enough fiber from foods.   °· Drink enough fluids to keep your urine clear or pale yellow.   °· Exercise regularly or as directed by your health care provider.   °· Go to the restroom when you have the urge to go. Do not hold it.   °· Only take over-the-counter or prescription medicines as directed by your health care provider. Do not take other medicines for constipation without talking to your health care provider first.   °SEEK IMMEDIATE MEDICAL CARE IF:  °· You have bright red blood in your stool.   °· Your constipation lasts for more than 4 days or gets worse.   °· You have abdominal or rectal pain.   °· You have thin, pencil-like stools.   °· You have unexplained weight loss. °MAKE SURE YOU:  °· Understand these instructions. °· Will watch your condition. °· Will get help right away if you are not doing well or get worse. °  °This information is not intended to replace advice given to you by your health care provider. Make sure you discuss any questions you have with your health care provider. °  °Document Released: 09/09/2004 Document Revised: 01/02/2015 Document Reviewed: 09/23/2013 °Elsevier Interactive Patient Education ©2016 [employer] ° °

## 2015-12-07 NOTE — ED Notes (Signed)
Pt here for abd pain since yesterday with last BM yesterday. Denies any N/V/D.

## 2016-05-03 IMAGING — CR DG HAND COMPLETE 3+V*R*
3 series · 3 of 3 positions shown · non-contrast
Comparison: None.

CLINICAL DATA: Punched a wall with right hand pain and laceration.
Initial encounter.

EXAM:
RIGHT HAND - COMPLETE 3+ VIEW

[hand pa]
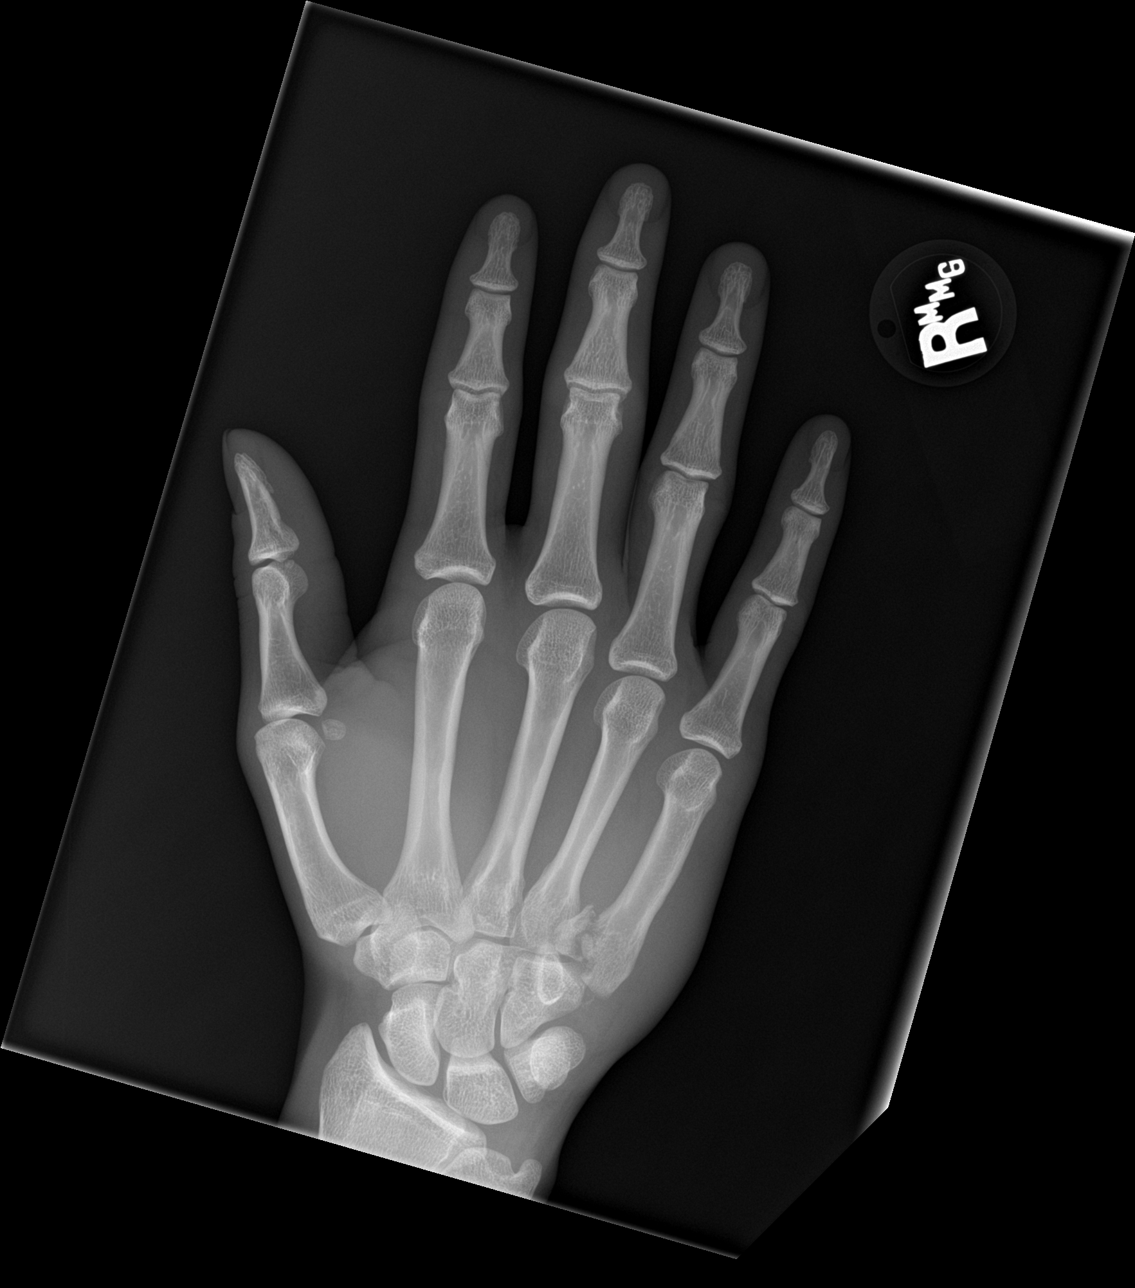

[hand obl]
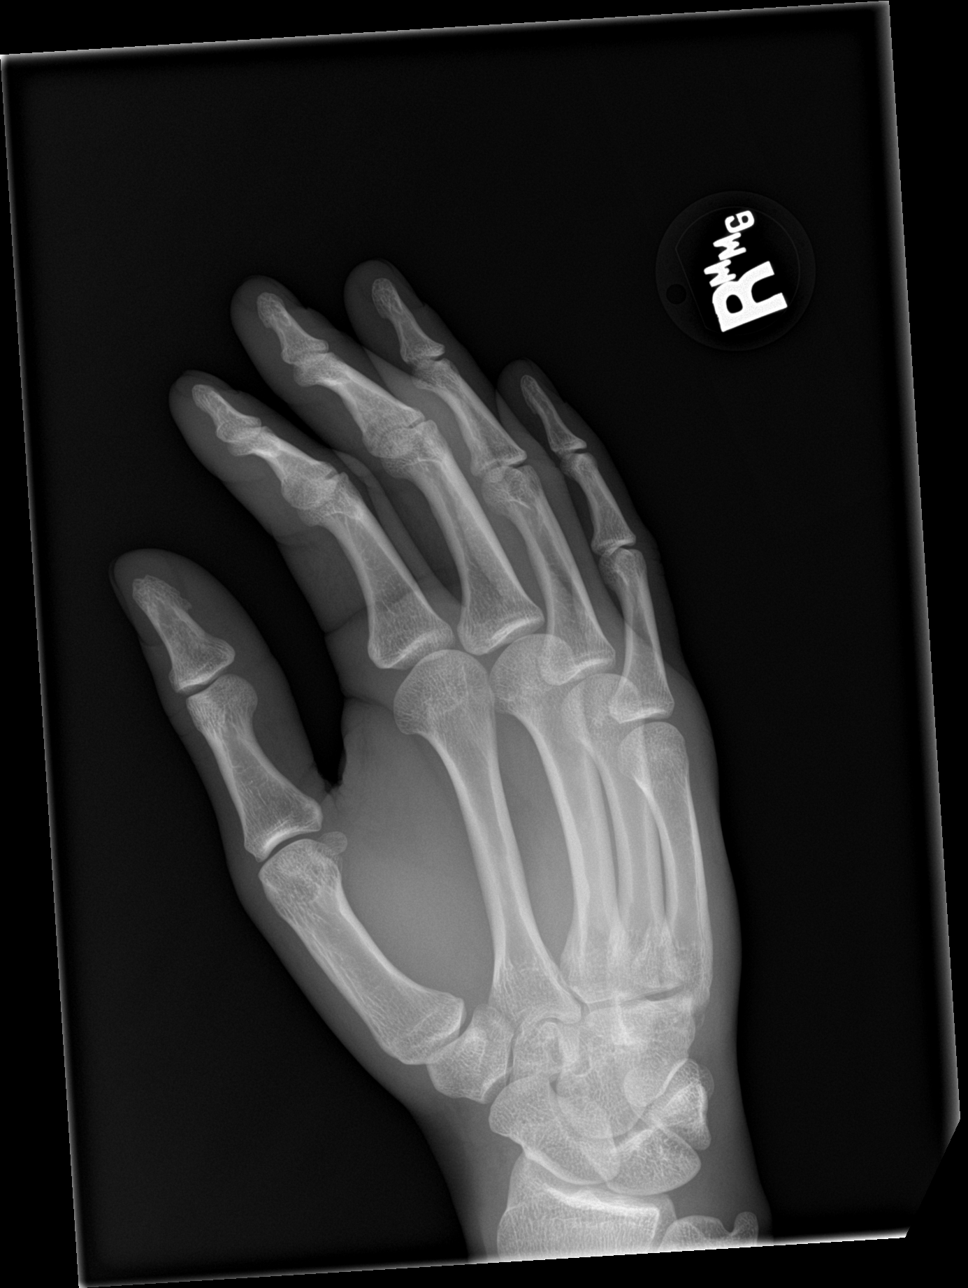

[hand lat]
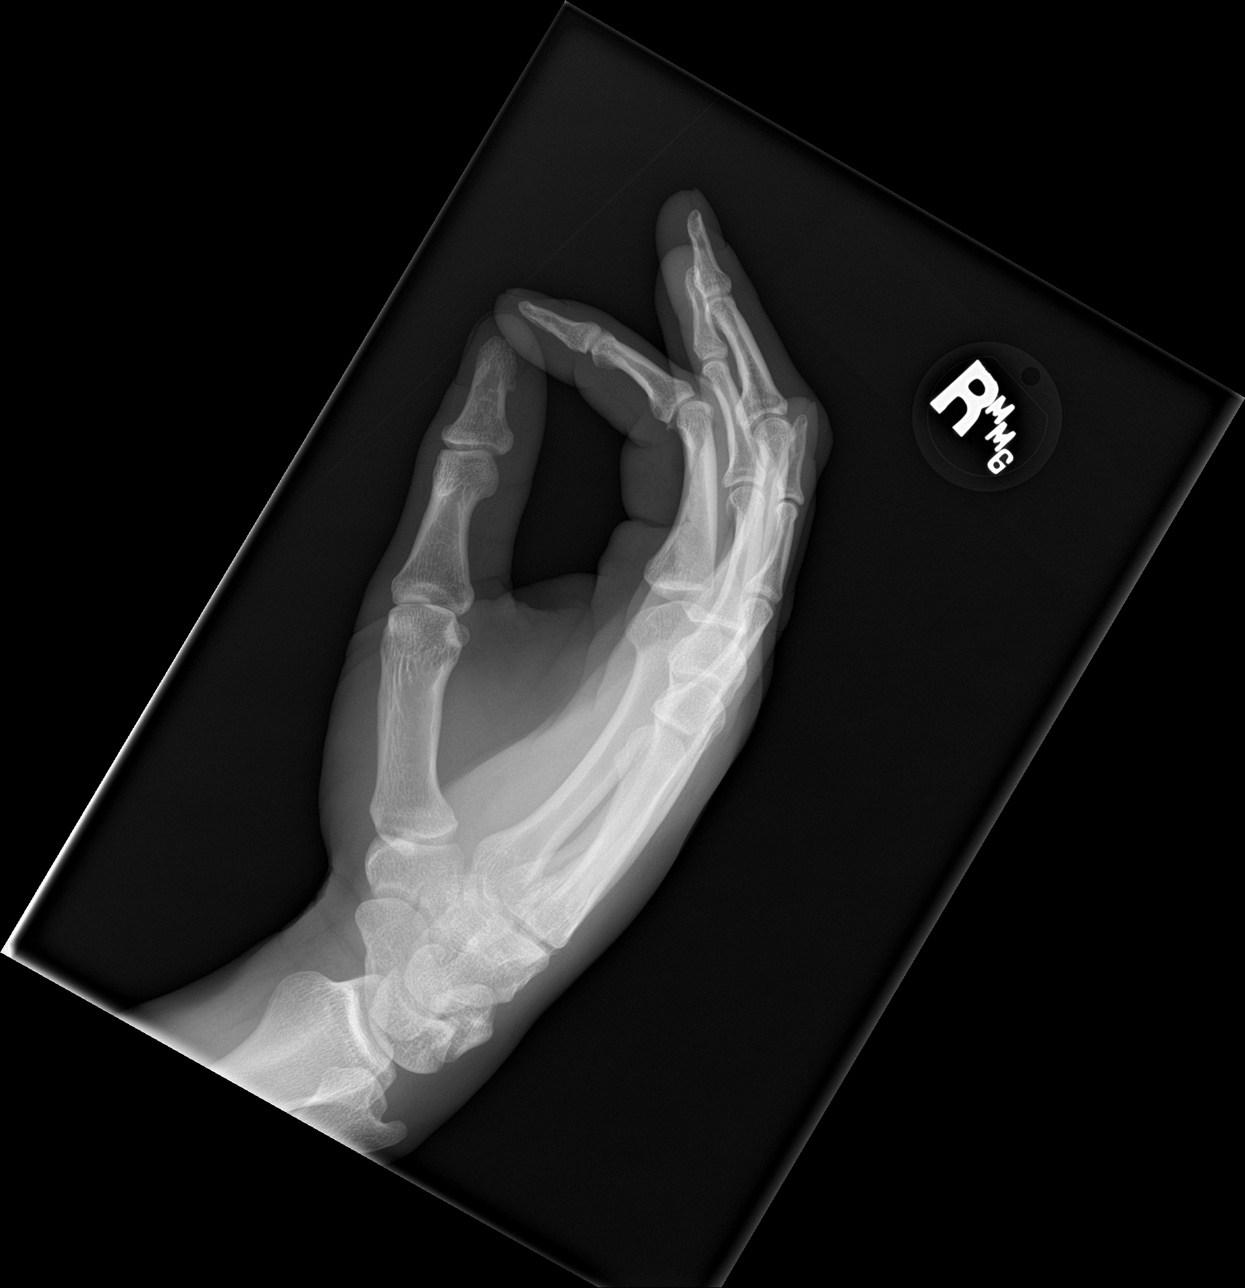

[3 of 3 positions shown; findings below may reference images not displayed]

FINDINGS: There is an intra-articular fracture of the fifth metacarpal base
with fracture widening of 5 mm along the articular surface. There is
a small avulsion fracture from the hamate or metacarpal at the level
of the CMC joint. No additional injury noted.
IMPRESSION: Displaced fifth metacarpal base fracture.

## 2016-11-21 IMAGING — DX DG ABDOMEN 1V
1 series · 1 of 1 positions shown · non-contrast
Comparison: None.

CLINICAL DATA: Abdominal pain since yesterday. Last bowel movement
yesterday.

EXAM:
ABDOMEN - 1 VIEW

[abdomen kub]
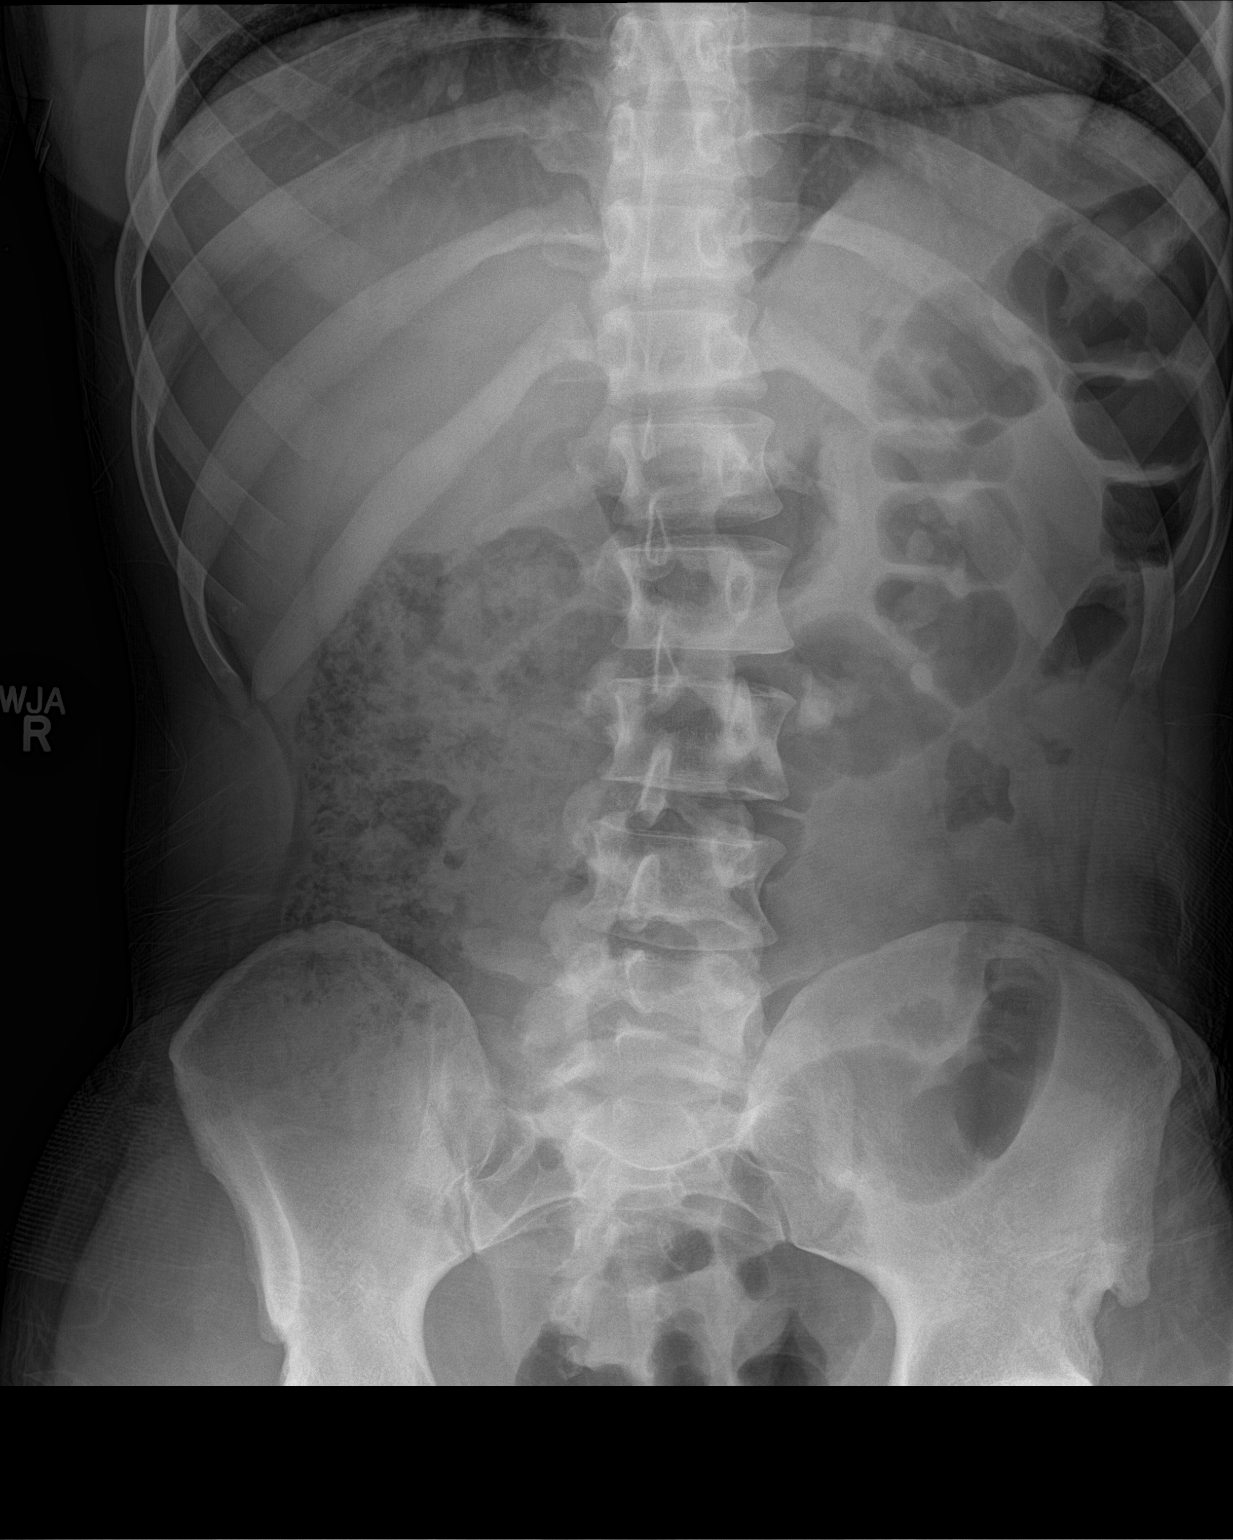

[1 of 1 positions shown; findings below may reference images not displayed]

FINDINGS: Gas and stool scattered throughout the colon. No small or large
bowel distention. No radiopaque stones. Visualized bones appear
intact.
IMPRESSION: Nonobstructive bowel gas pattern.

## 2018-02-07 DIAGNOSIS — H5213 Myopia, bilateral: Secondary | ICD-10-CM | POA: Diagnosis not present

## 2018-10-30 ENCOUNTER — Telehealth (HOSPITAL_COMMUNITY): Payer: Self-pay | Admitting: Psychology

## 2018-11-01 ENCOUNTER — Encounter (HOSPITAL_COMMUNITY): Payer: Self-pay | Admitting: Psychology

## 2018-11-01 ENCOUNTER — Other Ambulatory Visit (HOSPITAL_COMMUNITY): Payer: BLUE CROSS/BLUE SHIELD | Attending: Psychiatry | Admitting: Psychology

## 2018-11-01 ENCOUNTER — Encounter: Payer: Self-pay | Admitting: Medical

## 2018-11-01 ENCOUNTER — Ambulatory Visit (HOSPITAL_COMMUNITY): Payer: Self-pay | Admitting: Psychology

## 2018-11-01 DIAGNOSIS — F17200 Nicotine dependence, unspecified, uncomplicated: Secondary | ICD-10-CM | POA: Insufficient documentation

## 2018-11-01 DIAGNOSIS — F1994 Other psychoactive substance use, unspecified with psychoactive substance-induced mood disorder: Secondary | ICD-10-CM | POA: Insufficient documentation

## 2018-11-01 DIAGNOSIS — F19980 Other psychoactive substance use, unspecified with psychoactive substance-induced anxiety disorder: Secondary | ICD-10-CM | POA: Insufficient documentation

## 2018-11-01 DIAGNOSIS — Z811 Family history of alcohol abuse and dependence: Secondary | ICD-10-CM | POA: Diagnosis not present

## 2018-11-01 DIAGNOSIS — F1020 Alcohol dependence, uncomplicated: Secondary | ICD-10-CM

## 2018-11-01 DIAGNOSIS — F1420 Cocaine dependence, uncomplicated: Secondary | ICD-10-CM | POA: Insufficient documentation

## 2018-11-01 DIAGNOSIS — F19982 Other psychoactive substance use, unspecified with psychoactive substance-induced sleep disorder: Secondary | ICD-10-CM | POA: Diagnosis not present

## 2018-11-01 DIAGNOSIS — Z813 Family history of other psychoactive substance abuse and dependence: Secondary | ICD-10-CM | POA: Insufficient documentation

## 2018-11-01 NOTE — Progress Notes (Addendum)
    Daily Group Progress Note  Program: CD-IOP   11/01/2018 Shane Gilbert [id number]  Diagnosis: Alcohol Use Disorder, Severe, Cocaine Use Disorder, severe   Sobriety Date: 10/29/18  Group Time: 1-2:30pm  Participation Level: Active  Behavioral Response: Appropriate and Sharing  Type of Therapy: Process Group  Interventions: Supportive  Topic: Process: The first half of group was spent in process. Members shared about any challenges, struggles or 'shining moments in early recovery'. Members shared about the things they had done since we last met, including any actions related or enhancing their sobriety. A new group member was present, and he introduced himself and shared about what had brought him here. Two random drug tests were collected. One group member was absent.   Group Time: 2:30-4pm  Participation Level: Active   Behavioral Response: Sharing  Type of Therapy: Psycho-ED  Interventions: Strength-based  Topic: Psychoeducation: Relapse Prevention. The second half of group was spent in a psycho-ed. The topic for today was a follow-up to yesterday's session on the causes of relapse. This session focused on relapse prevention. A slide show from YouTube was shown with a young male recovering addict identifying the five most common causes of relapse.it was a lively and energizing slide show and invited active feedback among group members. every member was able to identify something that they could relate to or had experienced in the past. There was some tension near the end of group when the counselor challenged two members to put away their phones. It generated a conversation about this behavior, that was ultimately identified as being rude to fellow group members. The session was intense, but members were awake and alive, and the session proved very effective.  Summary: The patient was new to the group and asked to introduce himself and tell the group what has brought him  here. He reported a life filled with drinking and drugging. He reported he had done well in high school and had been very involved in school activities, but got in with the wrong crowd. He reported that he had attended two NA meetings in the last few days. He recognizes he has to leave his old friends behind. The patient explained that cocaine, alcohol and cigarettes all go together and he has to stop all three or he will go back to them. In the psycho-ed, the patient was attentive and asked good questions of his fellow group members. He was not familiar with the acronym, HALT, and one group member explained. The counselor asked him if he often ate before a night of drinking and he laughed and reported he rarely ate when he was using. He understood that having an empty stomach might be triggering for him. The patient was well-received by his new group members and appeared very relaxed and comfortable in this setting. He responded well to this intervention.    UDS collected: Yes, Pending  AA/NA attended?:Yes, NA, Tuesday and Wednesday  Sponsor?: no   [name], LCAS 11/01/2018 8:45 PM

## 2018-11-01 NOTE — Progress Notes (Signed)
Comprehensive Clinical Assessment (CCA) Note  11/01/2018 BEE HAMMERSCHMIDT [id number]  Visit Diagnosis:   Alcohol Use Disorder, Severe Cocaine Use Disorder, Severe    CCA Part One  Part One has been completed on paper by the patient.  (See scanned document in Chart Review)  CCA Part Two A  Intake/Chief Complaint:  CCA Intake With Chief Complaint CCA Part Two Date: 11/01/18 CCA Part Two Time: 79 Chief Complaint/Presenting Problem: I have been drinking and drugging for a long time. I am sick of it. My aunt called and asked if I wanted her to help me. I said "Yes, and that is how I am here".  Patients Currently Reported Symptoms/Problems: I drink everyday and drug everyday. I cannot keep a job and I am homeless were it not for my grandparents. I cannot stop drinking once a I start, feel very remorseful about my use and have blackouts. I spend all of my money buying drugs. i don't find much pleasure in doing things, I cannot sleep well and I feel tiured and bad about myself. Like I am a failure. I don't want to be like this any longer.  Collateral Involvement: Patient has signed consent for grandparents to  speak with counselor and his aunt Individual's Strengths: Motivated, articulate, family support, patient admits he is an addict and alcoholic Individual's Preferences: prefers a group setting and support for his recovery Individual's Abilities: articulate, good social skills and people skills Type of Services Patient Feels Are Needed: something intensive, I would like to try this before going to residential treatment Initial Clinical Notes/Concerns: Patient is very bright and articulate. he has a lot of charisma and could be a very powerful influence on others as well as himself  Mental Health Symptoms Depression:  Depression: Sleep (too much or little), Tearfulness, Worthlessness  Mania:  Mania: N/A  Anxiety:   Anxiety: Worrying, Restlessness, Irritability, Tension  Psychosis:   Psychosis: N/A  Trauma:  Trauma: N/A  Obsessions:  Obsessions: N/A  Compulsions:  Compulsions: N/A  Inattention:     Hyperactivity/Impulsivity:  Hyperactivity/Impulsivity: N/A  Oppositional/Defiant Behaviors:  Oppositional/Defiant Behaviors: N/A  Borderline Personality:  Emotional Irregularity: N/A  Other Mood/Personality Symptoms:      Mental Status Exam Appearance and self-care  Stature:  Stature: Average  Weight:  Weight: Thin  Clothing:  Clothing: Casual  Grooming:  Grooming: Normal  Cosmetic use:  Cosmetic Use: None  Posture/gait:  Posture/Gait: Normal  Motor activity:  Motor Activity: Not Remarkable  Sensorium  Attention:  Attention: Normal  Concentration:  Concentration: Normal  Orientation:  Orientation: X5  Recall/memory:  Recall/Memory: Normal  Affect and Mood  Affect:  Affect: Appropriate  Mood:     Relating  Eye contact:  Eye Contact: Normal  Facial expression:  Facial Expression: Responsive  Attitude toward examiner:  Attitude Toward Examiner: Cooperative  Thought and Language  Speech flow: Speech Flow: Normal  Thought content:  Thought Content: Appropriate to mood and circumstances  Preoccupation:     Hallucinations:     Organization:     Transport planner of Knowledge:  Fund of Knowledge: Average  Intelligence:  Intelligence: Average  Abstraction:  Abstraction: Normal  Judgement:  Judgement: Fair  Art therapist:  Reality Testing: Realistic  Insight:  Insight: Fair  Decision Making:  Decision Making: Impulsive  Social Functioning  Social Maturity:  Social Maturity: Irresponsible, Isolates  Social Judgement:  Social Judgement: "Fish farm manager  Stress  Stressors:  Stressors: Family conflict, Housing, Chiropodist, Work  Coping Ability:  Coping Ability: Exhausted  Skill Deficits:     Supports:      Family and Psychosocial History: Family history Marital status: Single Are you sexually active?: Yes What is your sexual orientation?:  Heterosexual Has your sexual activity been affected by drugs, alcohol, medication, or emotional stress?: no Does patient have children?: No  Childhood History:  Childhood History By whom was/is the patient raised?: Grandparents Additional childhood history information: patient's mother took him to his father's parents when patient was 55 months old and reported she couldn't take care of him. His paternal grandparents raised him.   Description of patient's relationship with caregiver when they were a child: it was good Patient's description of current relationship with people who raised him/her: It is good - his grandmother pays for his health insurance every month and they loved him. But they are also very confused and upset abou this drug use and problems holding a job. They are giving him another chance before he has to leave their home How were you disciplined when you got in trouble as a child/adolescent?: appropriately Does patient have siblings?: No Did patient suffer any verbal/emotional/physical/sexual abuse as a child?: No Did patient suffer from severe childhood neglect?: No Has patient ever been sexually abused/assaulted/raped as an adolescent or adult?: No Witnessed domestic violence?: No Has patient been effected by domestic violence as an adult?: No  CCA Part Two B  Employment/Work Situation: Employment / Work Copywriter, advertising Employment situation: Product manager job has been impacted by current illness: Yes Describe how patient's job has been impacted: he leaves jobs due to his alcohol and drug use What is the longest time patient has a held a job?: two years 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 Currently Attending: N/A Last Grade Completed: 12 Name of [place]: [employer] in [place] Did You Graduate From [employer]?: Yes Did Physicist, medical?: Yes What Type of College Degree  Do you Have?: I only took one semester of Biology at [employer] Did [place]?: No What Was Your Major?: I only took one semester of Biology Did You Have Any Special Interests In School?: I played sports in high school and then I was on the 'Step Team'. But when I met the kids who would become my friends and introduced me to drugs then I stopped all my other extracurricular activities Did You Have An Individualized Education Program (IIEP): No Did You Have Any Difficulty At School?: No  Religion: Religion/Spirituality Are You A Religious Person?: Yes What is Your Religious Affiliation?: Methodist How Might This Affect Treatment?: It might help. I go to NA and having a Higher Power is part of the recovery process  Leisure/Recreation: Leisure / Recreation Leisure and Hobbies: I play basketball and do a lot of pushups  Exercise/Diet: Exercise/Diet Do You Exercise?: Yes What Type of Exercise Do You Do?: Other (Comment) How Many Times a Week Do You Exercise?: 1-3 times a week Have You Gained or Lost A Significant Amount of Weight in the Past Six Months?: No Do You Follow a Special Diet?: No Do You Have Any Trouble Sleeping?: Yes Explanation of Sleeping Difficulties: I cannot stay asleep  CCA Part Two C  Alcohol/Drug Use: Alcohol / Drug Use Pain Medications: N/A Prescriptions: N/A Over the Counter: N/A History of alcohol / drug use?: Yes Longest period of sobriety (when/how long): Two months - that was in early 2019. Negative  Consequences of Use: Financial, Personal relationships, Work / School Withdrawal Symptoms: Blackouts, Irritability Substance #1 Name of Substance 1: Alcohol 1 - Age of First Use: 16 1 - Amount (size/oz): 9-10 beers and maybe a couple of shots 1 - Frequency: Daily 1 - Duration: a few years 1 - Last Use / Amount: November 3 - 7-8 beers Substance #2 Name of Substance 2: cocaine 2 - Age of First Use: 23 2 - Amount (size/oz): one to one and  one/half grams  2 - Frequency: I have been using daily for the last few weeks 2 - Duration: for the last few weeks 2 - Last Use / Amount: November 3  - one gram - I snort it Substance #3 Name of Substance 3: Marijuana 3 - Age of First Use: 18 3 - Amount (size/oz): a few hits 3 - Frequency: 1-2 times per week. I used to smoke daily, but the cocaine took over my attention 3 - Duration: since I was 18 3 - Last Use / Amount: November 1 - a few hits Substance #4 Name of Substance 4: Hallucinogens 4 - Age of First Use: 25 4 - Amount (size/oz): I have used LSD five times, mushrooms once and ex and MDMA a few times 4 - Frequency: when they become available 4 - Duration: off and on for a couple of years 4 - Last Use / Amount: July of 2019 - I did some acid Substance #5 Name of Substance 5: Adderall 5 - Age of First Use: 21 5 - Amount (size/oz): one or two pills depending on the dosage 5 - Frequency: once or twice a month, when someone has them 5 - Duration: off and on for two years 5 - Last Use / Amount: November 2  - one pill            CCA Part Three  ASAM's:  Six Dimensions of Multidimensional Assessment  Dimension 1:  Acute Intoxication and/or Withdrawal Potential:  Dimension 1:  Comments: Patient has stopped before and not experienced anything suggestive of seizure, but has felt badly  Dimension 2:  Biomedical Conditions and Complications:  Dimension 2:  Comments: adequate ability to cope with physical discomfort  Dimension 3:  Emotional, Behavioral, or Cognitive Conditions and Complications:  Dimension 3:  Comments: Patient's PHQ-9 is a 16 and the depressive symptoms, loss of interest and sleeplessness make it "extremely difficult" to function in daily life  Dimension 4:  Readiness to Change:  Dimension 4:  Comments: Patient's aunt agreed to help him find treatment and he reports he is interested. No treatment history and heavy length of use  Dimension 5:  Relapse, Continued use,  or Continued Problem Potential:  Dimension 5:  Comments: The potential for relapse is quite high and seems almost certain.  Dimension 6:  Recovery/Living Environment:  Dimension 6:  Recovery/Living Environment Comments: patient lives with grandparents and they are very supportive, but also very naive and unfamiliar with drugs or symptoms of use. Patient could use easily without them knowing.   Substance use Disorder (SUD) Substance Use Disorder (SUD)  Checklist Symptoms of Substance Use: Continued use despite persistent or recurrent social, interpersonal problems, caused or exacerbated by use, Large amounts of time spent to obtain, use or recover from the substance(s), Evidence of tolerance, Presence of craving or strong urge to use, Recurrent use that results in a fialure to fulfill major rule obligatinos (work, school, home), Social, occupational, recreational activities given up or reduced due to use, Substance(s)  often taken in large amounts or over longer times than was intended, Repeated use in physically hazardous situations, Continued use despite having a persistent/recurrent physical/psychological problem caused/exacerbated by use, Persistent desire or unsuccessful efforts to cut down or control use  Social Function:  Social Functioning Social Maturity: Irresponsible, Isolates Social Judgement: "Games developer"  Stress:  Stress Stressors: Family conflict, Housing, Chiropodist, Work Coping Ability: Exhausted Patient Takes Medications The Nurse, learning disability Instructed?: Yes Priority Risk: Low Acuity  Risk Assessment- Self-Harm Potential: Risk Assessment For Self-Harm Potential Thoughts of Self-Harm: No current thoughts Method: No plan Availability of Means: No access/NA Additional Comments for Self-Harm Potential: No history of self-harm  Risk Assessment -Dangerous to Others Potential: Risk Assessment For Dangerous to Others Potential Method: No Plan Availability of Means: No access or  NA Intent: Vague intent or NA Notification Required: No need or identified person Additional Comments for Danger to Others Potential: Not a violent person  DSM5 Diagnoses: There are no active problems to display for this patient.   Patient Centered Plan: Patient is on the following Treatment Plan(s):  Return this afternoon and begin the CD-IOP.  Recommendations for Services/Supports/Treatments: Recommendations for Services/Supports/Treatments Recommendations For Services/Supports/Treatments: CD-IOP Intensive Chemical Dependency Program  Treatment Plan Summary:    Referrals to Alternative Service(s): Referred to Alternative Service(s):   Place:   Date:   Time:    Referred to Alternative Service(s):   Place:   Date:   Time:    Referred to Alternative Service(s):   Place:   Date:   Time:    Referred to Alternative Service(s):   Place:   Date:   Time:     [name]

## 2018-11-04 NOTE — Progress Notes (Signed)
The patient is a 27 yo single, black, male seeking treatment to address his chemical dependency. He lives in [place] with his paternal grandparents. The patient has a long history of drug use that began in his mid-teens. His primary drugs of addiction are alcohol and cocaine. He has also used cannabis, hallucinogens and stimulants off and on for years. Remarkably, the patient has no history of legal problems, but has experienced negative consequences relative to employment, education, mental health and family relationships. The patient was born and raised in [place]. At 39 months old, the patient's mother took her child to her in-laws and admitted she couldn't care for him. They took him in, and they are the only parents he has ever known. The patient's childhood was "great. My grandmother raised me". He excelled in classes and activities. 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 The patient reported he was very active in the "[employer] [address]" and many thought he would go into [employer]. However, during his junior year he got a part-time job, stopped extra-curricular activities and soon got in with the wrong crowd. The patient reported he worked at [employer] while in high school. However, his two years employment at "[employer]" in his early twenties introduced him to more serious drug use that has increased markedly in the last two years. The patient has remarkably little knowledge about his parents. He reports his father was very successfully and responsible until around age 24 when he began using drugs. The patient states his father lives in [place], but he has no information about is father's work situation or living circumstances beyond some familiarity with two half-sisters. He is equally unaware of his mother's circumstances, other than to confirm he has three half-siblings with two fathers. The patient identified his  genetic predisposition via his father, paternal uncle and great uncle. His mother is an addict as is her brother. The patient's mother was diagnosed with Multiple Sclerosis in her twenties and is wheelchair bound per his report. The patient reported he has been living with friends for several years, but recently moved back in with his grandparents after his roommates moved out and he couldn't no longer afford the rent. The patient displays remarkable insight, is very articulate and charismatic. He admitted to this counselor that he had not sought treatment. However, his paternal aunt had contacted him asking whether he wanted help. He told her that he did want help and sought change. That is what has brought him here. The patient confirmed he has never had treatment. His longest period of sobriety is two months attained early this year. "I just wanted to make some changes", he reported. But subsequently, he returned to use. Today he reports having used four days ago and denied any history of withdrawal symptoms. The patient was enthusiastic about beginning the program. He will return this afternoon to enter the CD-IOP.

## 2018-11-05 ENCOUNTER — Other Ambulatory Visit (INDEPENDENT_AMBULATORY_CARE_PROVIDER_SITE_OTHER): Payer: BLUE CROSS/BLUE SHIELD | Admitting: Psychology

## 2018-11-05 ENCOUNTER — Encounter (HOSPITAL_COMMUNITY): Payer: Self-pay | Admitting: Medical

## 2018-11-05 VITALS — BP 126/72 | HR 80

## 2018-11-05 DIAGNOSIS — F19982 Other psychoactive substance use, unspecified with psychoactive substance-induced sleep disorder: Secondary | ICD-10-CM

## 2018-11-05 DIAGNOSIS — Z811 Family history of alcohol abuse and dependence: Secondary | ICD-10-CM

## 2018-11-05 DIAGNOSIS — F1020 Alcohol dependence, uncomplicated: Secondary | ICD-10-CM | POA: Diagnosis not present

## 2018-11-05 DIAGNOSIS — F1994 Other psychoactive substance use, unspecified with psychoactive substance-induced mood disorder: Secondary | ICD-10-CM

## 2018-11-05 DIAGNOSIS — F19980 Other psychoactive substance use, unspecified with psychoactive substance-induced anxiety disorder: Secondary | ICD-10-CM

## 2018-11-05 DIAGNOSIS — Z813 Family history of other psychoactive substance abuse and dependence: Secondary | ICD-10-CM

## 2018-11-05 DIAGNOSIS — F1420 Cocaine dependence, uncomplicated: Secondary | ICD-10-CM

## 2018-11-05 DIAGNOSIS — Z87898 Personal history of other specified conditions: Secondary | ICD-10-CM | POA: Insufficient documentation

## 2018-11-05 DIAGNOSIS — F17200 Nicotine dependence, unspecified, uncomplicated: Secondary | ICD-10-CM

## 2018-11-05 MED ORDER — BACLOFEN 10 MG PO TABS: 10.0000 mg | ORAL_TABLET | Freq: Three times a day (TID) | ORAL | 2 refills | Status: DC

## 2018-11-05 MED ORDER — NALTREXONE HCL 50 MG PO TABS: 50.0000 mg | ORAL_TABLET | Freq: Every day | ORAL | 1 refills | Status: AC

## 2018-11-05 NOTE — Progress Notes (Signed)
Psychiatric Initial Adult Assessment   Patient Identification: BERTEL VENARD MRN:  [id number] Date of Evaluation:  11/08/2018 Referral Source: Aunt Chief Complaint:   Chief Complaint    Establish Care; Addiction Problem; Alcohol Problem; Drug Problem; Family Problem; Panic Attack     Visit Diagnosis:    ICD-10-CM   1. Cocaine use disorder, severe, dependence ([place]) F14.20   2. Alcohol use disorder, severe, dependence ([place]) F10.20   3. Family history of drug addiction Z81.3   4. Family hx of alcoholism Z81.1   5. Current smoker F17.200   6. Substance-induced sleep disorder ([place]) F19.982   7. 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 He contacted counselor November 10: The patient is a 27 yo single, black, male seeking treatment to address his chemical dependency. He lives in [place] with his paternal grandparents. The patient has a long history of drug use that began in his mid-teens. His primary drugs of addiction are alcohol and cocaine. He has also used cannabis, hallucinogens and stimulants off and on for years. Remarkably, the patient has no history of legal problems, but has experienced negative consequences relative to employment, education, mental health and family relationships. The patient was born and raised in [place]. At 58 months old, the patient's mother took her child to her in-laws and admitted she couldn't care for him. They took him in, and they are the only parents he has ever known. The patient's childhood was "great. My grandmother raised me". He excelled in classes and activities. 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 The patient  reported he was very active in the "[employer]" and many thought he would go into [employer]. However, during his junior year he got a part-time job, stopped extra-curricular activities and soon got in with the wrong crowd. The patient reported he worked at [employer] while in [place]. However, his two years employment at "[employer]" in his early twenties introduced him to more serious drug use that has increased markedly in the last two years. The patient has remarkably little knowledge about his parents. He reports his father was very successfully and responsible until around age 47 when he began using drugs. The patient states his father lives in [place], but he has no information about is father's work situation or living circumstances beyond some familiarity with two half-sisters. He is equally unaware of his mother's circumstances, other than to confirm he has three half-siblings with two fathers. The patient identified his genetic predisposition via his father, paternal uncle and great uncle. His mother is an addict as is her brother. The patient's mother was diagnosed with Multiple Sclerosis in her twenties and is wheelchair bound per his report. The patient reported he has been living with friends for several years, but recently moved back in with his grandparents after his roommates moved out and he couldn't no longer afford the rent. The patient displays remarkable insight, is very articulate and charismatic. He admitted to this counselor that he had not sought treatment. However, his paternal aunt had contacted him asking whether he wanted help. He told her that he did want help and sought  change. That is what has brought him here. The patient confirmed he has never had treatment. His longest period of sobriety is two months attained early this year. "I just wanted to make some changes", he reported. But subsequently, he returned to use. Today he reports having  used four days ago and denied any history of withdrawal symptoms. The patient was enthusiastic about beginning the program. He will return this afternoon to enter the CD-IOP.    He admits to significant cravings and wants MAT.He is introduced to the biology of addiction PET scan of addicted brains and will review Breaking The Addictive Cycle with Delphia Grates RN on You Tube and do the exercises in the video. Associated Signs/Symptoms: DSM 5 SUD Criteria 11/11+ Severe dependence AUDIT Score 37/+ Questions 9/10 ASAM Dimension 1:  Acute Intoxication and/or Withdrawal Potential:  Dimension 1:  Comments: Patient has stopped before and not experienced anything suggestive of seizure, but has felt badly  Dimension 2:  Biomedical Conditions and Complications:  Dimension 2:  Comments: adequate ability to cope with physical discomfort  Dimension 3:  Emotional, Behavioral, or Cognitive Conditions and Complications:  Dimension 3:  Comments: Patient's PHQ-9 is a 16 and the depressive symptoms, loss of interest and sleeplessness make it "extremely difficult" to function in daily life  Dimension 4:  Readiness to Change:  Dimension 4:  Comments: Patient's aunt agreed to help him find treatment and he reports he is interested. No treatment history and heavy length of use  Dimension 5:  Relapse, Continued use, or Continued Problem Potential:  Dimension 5:  Comments: The potential for relapse is quite high and seems almost certain.  Dimension 6:  Recovery/Living Environment:  Dimension 6:  Recovery/Living Environment Comments: patient lives with grandparents and they are very supportive, but also very naive and unfamiliar with drugs or symptoms of use. Patient could use easily without them knowing.    Depression Symptoms:   depressed mood,3 anhedonia,2 loss of energy/fatigue,2 disturbed sleep,3 decreased appetite,2 PHQ 9 score 16 Extremely difficult  (Hypo) Manic Symptoms: Substance use related   Delusions, Hallucinations, Labiality of Mood,   Anxiety Symptoms:  Excessive Worry, Gad 7 score 10 Moderately difficult Psychotic Symptoms: Substance use related   Delusions, Hallucinations: Visual PTSD Symptoms: Negative  Past Psychiatric History: NA  Previous Psychotropic Medications: No   Substance Abuse History in the last 12 months:   Substance Abuse History in the last 12 months: Substance Age of 1st Use Last Use Amount Specific Type  Nicotine 16 CURRENT ppd CIGS  Alcohol 16 10/28/2018 9-10 BEERS 3-5 SHOTS   Cannabis 18 10/26/2018 jOINT 2-3 HITS  Opiates      Cocaine 23 1-1/1/2 GM POWDER SORT  Methamphetamines 21  1-2/MONTH  ADDERALL  LSD 25  5X   Ecstasy      Benzodiazepines 25   1X XANAX  Caffeine      Inhalants      Others:                          Consequences of Substance Abuse: Medical Consequences: Boxer's Fx Rt hand 2 yrs ago hitting wall while intoxicated Legal Consequences:  NONE YET Family Consequences:  LOSS OF TRUST;WORRY;CONCERN Blackouts:  YES DT's: NA Withdrawal Symptoms:   Headaches Nausea Tremors SLEEP DISTURBANCE;SIMD  Past Medical History: 2014 Fx Rt hand   Surgical History Past Surgical History:  Procedure Laterality Date  . ESOPHAGOGASTRODUODENOSCOPY  11/15/2011   Procedure: ESOPHAGOGASTRODUODENOSCOPY (EGD);  Surgeon:  Owens Loffler, MD;  Location: [facility];  Service: Gastroenterology;  Laterality: N/A;   Family Psychiatric History: Both parents;PGF;Uncle and Aunt addicted  Family History: Mother has MS  Social History:   Social History   Socioeconomic History  . Marital status: Single    Spouse name: Na  . Number of children: None  . Years of education: School Currently Attending: N/A Last Grade Completed: 12 Name of High School: [employer] in [place] Did You Graduate From [employer]?: Yes Did Physicist, medical?: Yes What Type of College Degree Do you Have?: I only took one semester of Biology at [employer] Did [place]?: No What Was Your Major?: I only took one semester of Biology Did You Have Any Special Interests In School?: I played sports in high school and then I was on the 'Step Team'. But when I met the kids who would become my friends and introduced me to drugs then I stopped all my other extracurricular activities Did You Have An Individualized Education Program (IIEP): No Did You Have Any Difficulty At School?: No   . Highest education level: 12  Occupational History  .   Social Needs  . Financial resource strain: No money due to cocaine  . Food insecurity:    Worry: No    Inability: No  . Transportation needs:    Medical: No    Non-medical: No  Tobacco Use  . Smoking status:  Smoker  . Smokeless tobacco: Never Used  Substance and Sexual Activity  . Alcohol use: Yes    Comment: social  . Drug use: Yes    Types: Marijuana  . Sexual activity: Are you sexually active?: Yes What is your sexual orientation?: Heterosexual Has your sexual activity been affected by drugs, alcohol, medication, or emotional stress?: no Does patient have children?: No   Lifestyle  . Physical activity:Exercise/Diet Do You Exercise?: Yes What Type of Exercise Do You Do?: Other (Comment) How Many Times a Week Do You Exercise?: 1-3 times a week Have You Gained or Lost A Significant Amount of Weight in the Past Six Months?: No Do You Follow a Special Diet?: No Do You Have Any Trouble Sleeping?: Yes Explanation of Sleeping Difficulties: I cannot stay asleep             . Stress: Stressors:  Stressors: Family conflict, Housing, Chiropodist, Work  Coping Ability:  Coping Ability: Exhausted     Relationships  . Social connections:    Talks on phone: Not on file    Gets together: Not on file    Attends religious service: Not on file    Active member of club or organization: Not on file    Attends meetings of clubs or organizations: Not on file    Relationship status: Not on file  Other Topics  Concern  . Childhood History By whom was/is the patient raised?: Grandparents Additional childhood history information: patient's mother took him to his father's parents when patient was 28 months old and reported she couldn't take care of him. His paternal grandparents raised him.   Description of patient's relationship with caregiver when they were a child: it was good Patient's description of current relationship with people who raised him/her: It is good - his grandmother pays for his health insurance every month and they loved him. But they are also very confused and upset abou this drug use and problems holding a job. They are giving him another chance before he has to  leave their home How were you disciplined when you got in trouble as a child/adolescent?: appropriately Does patient have siblings?: No Did patient suffer any verbal/emotional/physical/sexual abuse as a child?: No Did patient suffer from severe childhood neglect?: No Has patient ever been sexually abused/assaulted/raped as an adolescent or adult?: No Witnessed domestic violence?: No Has patient been effected by domestic violence as an adult?: No   Social History Narrative  . Chief Complaint/Presenting Problem: I have been drinking and drugging for a long time. I am sick of it. My aunt called and asked if I wanted her to help me. I said "Yes, and that is how I am here".  Patients Currently Reported Symptoms/Problems: I drink everyday and drug everyday. I cannot keep a job and I am homeless were it not for my grandparents. I cannot stop drinking once a I start, feel very remorseful about my use and have blackouts. I spend all of my money buying drugs. i don't find much pleasure in doing things, I cannot sleep well and I feel tiured and bad about myself. Like I am a failure. I don't want to be like this any longer.  Collateral Involvement: Patient has signed consent for grandparents to  speak with counselor and his  aunt Individual's Strengths: Motivated, articulate, family support, patient admits he is an addict and alcoholic Individual's Preferences: prefers a group setting and support for his recovery Individual's Abilities: articulate, good social skills and people skills Type of Services Patient Feels Are Needed: something intensive, I would like to try this before going to residential treatment Initial Clinical Notes/Concerns: Patient is very bright and articulate. he has a lot of charisma and could be a very powerful influence on others as well as himself    Allergies:  No Known Allergies  Metabolic Disorder Labs: No results found for: HGBA1C, MPG No results found for: PROLACTIN No results found for: CHOL, TRIG, HDL, CHOLHDL, VLDL, LDLCALC   Current Medications: Current Outpatient Medications  Medication Sig Dispense Refill  . baclofen (LIORESAL) 10 MG tablet Take 1 tablet (10 mg total) by mouth 3 (three) times daily. 90 tablet 2  . dicyclomine (BENTYL) 20 MG tablet Take 1 tablet (20 mg total) by mouth 2 (two) times daily. 20 tablet 0  . naltrexone (DEPADE) 50 MG tablet Take 1 tablet (50 mg total) by mouth daily for 180 doses. 90 tablet 1  . polyethylene glycol (MIRALAX) packet Take 17 g by mouth 3 (three) times daily. For 2-3 days or until you have good bowel evacuation 9 each 0  . traZODone (DESYREL) 50 MG tablet Take 1 tablet (50 mg total) by mouth at bedtime as needed and may repeat dose one time if needed for sleep. 60 tablet 2   No current facility-administered medications for this visit.     Neurologic: Headache: Negative Seizure: Negative Paresthesias:Negative  Musculoskeletal: Strength & Muscle Tone: within normal limits Gait & Station: normal Patient leans: N/A  Psychiatric Specialty Exam: Review of Systems  Constitutional: Positive for diaphoresis (night sweats), malaise/fatigue and weight loss. Negative for chills and fever.  HENT: Negative for congestion, ear  discharge, ear pain, hearing loss, nosebleeds, sinus pain, sore throat and tinnitus.   Eyes: Negative for blurred vision, double vision, photophobia, pain, discharge and redness.  Respiratory: Negative for cough, hemoptysis, sputum production, shortness of breath, wheezing and stridor.   Cardiovascular: Negative for chest pain, palpitations, orthopnea, claudication, leg swelling and PND.  Gastrointestinal: Negative for abdominal pain, blood in stool, constipation, diarrhea, heartburn,  melena, nausea and vomiting.  Genitourinary: Negative for dysuria, flank pain, frequency, hematuria and urgency.  Musculoskeletal: Negative for back pain, falls, joint pain, myalgias and neck pain.  Skin: Negative for itching and rash.  Neurological: Negative for dizziness, tingling, tremors, sensory change, speech change, focal weakness, seizures, loss of consciousness, weakness and headaches.  Endo/Heme/Allergies: Negative for environmental allergies and polydipsia. Does not bruise/bleed easily.  Psychiatric/Behavioral: Positive for depression, memory loss (Blackouts) and substance abuse. Negative for hallucinations and suicidal ideas. The patient is nervous/anxious and has insomnia.     Blood pressure 126/72, pulse 80.There is no height or weight on file to calculate BMI.  General Appearance: Well Groomed  Eye Contact:  Good  Speech:  Clear and Coherent  Volume:  Normal  Mood:  Anxious  Affect:  Congruent  Thought Process:  Coherent, Goal Directed and Descriptions of Associations: Intact  Orientation:  Full (Time, Place, and Person)  Thought Content:  WDL and Logical  Suicidal Thoughts:  No  Homicidal Thoughts:  No  Memory:  Hx of Blackouts  Judgement:  Impaired  Insight:  Lacking  Psychomotor Activity:  Normal  Concentration:  Concentration: Good and Attention Span: Good  Recall:  [place] of Knowledge:WDL  Language: WDL  Akathisia:  NA  Handed:  Right  AIMS (if indicated):  NA  Assets:   Communication Skills Desire for Improvement Financial Resources/Insurance [place] Talents/Skills Transportation Vocational/Educational  ADL's:  Intact  Cognition: Impaired,  Moderate  Sleep:  Disturbed    Treatment Plan Summary: Treatment Plan/Recommendations:  Plan of Care: SUDs and Core issues/dysfunctional family-childhood due to alcohol and drug addiction in both parents  Laboratory:  UDS per protocol  Psychotherapy: IOP Group;Individual and Family  Medications: MAT Naltrexone and Baclofen;Trazodone for sleep  Routine PRN Medications:  No  Consultations: None  Safety Concerns:  Inability to abstain  Other:  Get annual wellness exam if not done with labs     Darlyne Russian, PA-C 11/14/20194:02 PM

## 2018-11-06 ENCOUNTER — Encounter (HOSPITAL_COMMUNITY): Payer: Self-pay | Admitting: Psychology

## 2018-11-06 NOTE — Progress Notes (Signed)
    Daily Group Progress Note  Program: CD-IOP   11/06/2018 BERN FARE [id number]  Diagnosis: F10.20   Sobriety Date: 10/29/18  Group Time: 1-2:30pm  Participation Level: Active  Behavioral Response: Appropriate  Type of Therapy: Process Group  Interventions: Supportive  Topic: Process: the first half of group was spent in process. Members shared about this past weekend and identified the things they had done to support their recovery. Everyone had attended at least three AA or NA meetings. One member appeared after having missed all last week caring for her elderly and ill parents. The medical director met with three members, including the newest group member for his first session. Four random drug tests were collected today.  Group Time: 2:30-4pm  Participation Level: Minimal  Behavioral Response: Appropriate  Type of Therapy: Psycho-education Group  Interventions: Solution Focused  Topic: Psycho-Education: Deep Breaths/Relapse Prevention. The second half of group was spent in a psycho-ed on relapse prevention. This was a natural topic after having spent much of last week identifying triggers and 'people, places and things'. Prior to discussing the handouts, the counselor led the group in standing up and taking a few deep breaths, careful to breath in slowly and at the same pace, breathing out. Members took turns reading a handout on a relapse scenario and all provided input on what had gone wrong. Education continued with discussing possible ways of dealing with negative emotions, including breath work, Advertising account executive. One member shared something that was upsetting her that had happened this weekend and the remainder of the session was spent listening to her vent and providing supportive feedback.   Summary: The patient was enthusiastic and energized in group today. He shared that he had attended four NA meetings over the weekend. He discussed  the meetings and topics at length. The patient admitted he had had a 'very lucid drug dream' on Friday evening. he had awakened sweating and feeling as if he had actually used. "It was really powerful", he reported. The patient reported he was relieved that it was just a dream, but reexamined surprised at how real it had appeared. The patient shared that he had attended two basketball games his nephews had played in on Saturday and Sunday and had enjoyed watching them. He admitted he was saddened by realizing how long he has been absent from their lives and all that he had missed. The patient missed much of the psycho-ed meeting with the medical director. He returned in time to hear his fellow group member share about her painful experience. The patient remained drugfree over the weekend and made strong efforts to remain abstinent. He responded well to this intervention.   UDS collected: No  AA/NA attended?: YesThursday, Friday, Saturday and Sunday  Sponsor?: No   [name], LCAS 11/06/2018 11:13 AM

## 2018-11-07 ENCOUNTER — Other Ambulatory Visit (HOSPITAL_COMMUNITY): Payer: BLUE CROSS/BLUE SHIELD | Admitting: Psychology

## 2018-11-07 DIAGNOSIS — F1420 Cocaine dependence, uncomplicated: Secondary | ICD-10-CM

## 2018-11-07 DIAGNOSIS — Z811 Family history of alcohol abuse and dependence: Secondary | ICD-10-CM

## 2018-11-07 DIAGNOSIS — F1020 Alcohol dependence, uncomplicated: Secondary | ICD-10-CM

## 2018-11-07 DIAGNOSIS — F17200 Nicotine dependence, unspecified, uncomplicated: Secondary | ICD-10-CM

## 2018-11-07 DIAGNOSIS — Z813 Family history of other psychoactive substance abuse and dependence: Secondary | ICD-10-CM

## 2018-11-08 ENCOUNTER — Encounter (HOSPITAL_COMMUNITY): Payer: Self-pay | Admitting: Psychology

## 2018-11-08 ENCOUNTER — Other Ambulatory Visit (HOSPITAL_COMMUNITY): Payer: BLUE CROSS/BLUE SHIELD | Admitting: Psychology

## 2018-11-08 DIAGNOSIS — F1020 Alcohol dependence, uncomplicated: Secondary | ICD-10-CM

## 2018-11-08 DIAGNOSIS — F1420 Cocaine dependence, uncomplicated: Secondary | ICD-10-CM

## 2018-11-08 MED ORDER — TRAZODONE HCL 50 MG PO TABS: 50.0000 mg | ORAL_TABLET | Freq: Every evening | ORAL | 2 refills | Status: DC | PRN

## 2018-11-09 ENCOUNTER — Encounter (HOSPITAL_COMMUNITY): Payer: Self-pay | Admitting: Psychology

## 2018-11-09 NOTE — Progress Notes (Signed)
    Daily Group Progress Note  Program: CD-IOP   11/09/2018 [name] [id number]  Diagnosis:  Cocaine use disorder, severe, dependence ([facility]CC)  Alcohol use disorder, severe, dependence ([facility])   Sobriety Date: 11/4  Group Time: 1-2:30  Participation Level: Active  Behavioral Response: Appropriate and Sharing  Type of Therapy: Process Group  Interventions: CBT  Topic:  Pts were active and engaged in group processing session. Counselor facilitated CBT-based processing questions to help pts discuss their recovery from mind-altering drugs/alcohol. Emphasis was placed on pts disclosing their challenges and successes pertaining to their treatment plan goals. UDS results were returned to some members.       Group Time: 2:30-4  Participation Level: Active  Behavioral Response: Appropriate and Sharing  Type of Therapy: Psycho-education Group  Interventions: CBT and Assertiveness Training  Topic:  Patient were active and engaged in psychoeducation session on codependency and effective boundary setting. Pts shared openly w/ each other about their struggles to assert themselves and "people pleasing" bxs. A handout was provided and pt's took turns reading sections and discussing them.     Summary: Pt was active, engaged, upbeat, and made strong eye contact. He reported he spent time w/ his family members attending a local college basketball game in which his cousin was the star. Pt was excited to share w/ group a video of the performance. Pt shared about his productive individual session that occurred before this group. Pt revealed that he has always wanted to have an artistic career in theater, acting, and/or media. He processed his hx of feeling "told not to pursue his dreams" due to impracticality. Pt was provided w/ resources for exploring community theater options in [place] area. Pt identified as a strong "people pleaser" and avoider of conflict. Pt does not like conflict or  when others are upset w/ him. Pt was given some strategies for saying "no" and setting boundaries on his time.    UDS collected: No Results: negative  AA/NA attended?: No  Sponsor?: No   [name], LPCA LCASA 11/09/2018 10:07 AM

## 2018-11-12 ENCOUNTER — Encounter: Payer: Self-pay | Admitting: Medical

## 2018-11-12 ENCOUNTER — Encounter (HOSPITAL_COMMUNITY): Payer: Self-pay

## 2018-11-12 ENCOUNTER — Other Ambulatory Visit (HOSPITAL_COMMUNITY): Payer: BLUE CROSS/BLUE SHIELD | Admitting: Psychology

## 2018-11-12 DIAGNOSIS — F1420 Cocaine dependence, uncomplicated: Secondary | ICD-10-CM

## 2018-11-12 DIAGNOSIS — F1020 Alcohol dependence, uncomplicated: Secondary | ICD-10-CM

## 2018-11-12 NOTE — Progress Notes (Signed)
    Daily Group Progress Note  Program: CD-IOP   11/12/2018 Shane Gilbert [id number]  Diagnosis:  Cocaine use disorder, severe, dependence (HCC)  Alcohol use disorder, severe, dependence ([place])   Sobriety Date: 11/4  Group Time: 1-2:30  Participation Level: Active  Behavioral Response: Appropriate and Sharing  Type of Therapy: Process Group  Interventions: CBT  Topic: Pts were active and engaged in group processing session. Counselor facilitated CBT-based processing questions to help pts discuss their recovery from mind-altering drugs/alcohol. Emphasis was placed on pts disclosing their challenges and successes pertaining to their treatment plan goals. UDS results were returned to some members and new samples were collected from some members. One member met w/ Medical Director to establish care.       Group Time: 2:30-4  Participation Level: Active  Behavioral Response: Appropriate and Sharing  Type of Therapy: Psycho-education Group  Interventions: CBT and Assertiveness Training  Topic: Patient were active and engaged in psychoeducation session on "boundary setting behaviors". Pts were provided w/ handout on various types of boundaries and then asked to rate the health of their boundaries w/i their relationships w/ friends and family members.     Summary: Pt was active, engaged, and upbeat in group. He reported he attended 4 NA meetings since last Thursday. He smiled often when sharing. He discussed a problem he had in where he got angry w/ his grandmother and yelled at her over a small financial matter. Pt quickly apologized and recognized the issue promptly. He reported on many updates including getting his license plates renewed so he can drive himself, getting a second job at [employer], and he has identified a Proofreader at the [employer]. His sleep is improving, reports some cravings but they are passing. Pt reports having "using dreams".     UDS collected: Yes Results: most recent negative  AA/NA attended?: YesFriday, Saturday and Sunday  Sponsor?: No   Youlanda Roys, LPCA LCASA 11/12/2018 4:35 PM

## 2018-11-14 ENCOUNTER — Encounter (HOSPITAL_COMMUNITY): Payer: Self-pay

## 2018-11-14 ENCOUNTER — Other Ambulatory Visit (INDEPENDENT_AMBULATORY_CARE_PROVIDER_SITE_OTHER): Payer: BLUE CROSS/BLUE SHIELD | Admitting: Psychology

## 2018-11-14 ENCOUNTER — Encounter (HOSPITAL_COMMUNITY): Payer: Self-pay | Admitting: Licensed Clinical Social Worker

## 2018-11-14 DIAGNOSIS — Z813 Family history of other psychoactive substance abuse and dependence: Secondary | ICD-10-CM

## 2018-11-14 DIAGNOSIS — Z5189 Encounter for other specified aftercare: Secondary | ICD-10-CM

## 2018-11-14 DIAGNOSIS — F1994 Other psychoactive substance use, unspecified with psychoactive substance-induced mood disorder: Secondary | ICD-10-CM

## 2018-11-14 DIAGNOSIS — F19980 Other psychoactive substance use, unspecified with psychoactive substance-induced anxiety disorder: Secondary | ICD-10-CM

## 2018-11-14 DIAGNOSIS — F1420 Cocaine dependence, uncomplicated: Secondary | ICD-10-CM | POA: Diagnosis not present

## 2018-11-14 DIAGNOSIS — Z811 Family history of alcohol abuse and dependence: Secondary | ICD-10-CM

## 2018-11-14 DIAGNOSIS — F19982 Other psychoactive substance use, unspecified with psychoactive substance-induced sleep disorder: Secondary | ICD-10-CM

## 2018-11-14 DIAGNOSIS — F1020 Alcohol dependence, uncomplicated: Secondary | ICD-10-CM

## 2018-11-14 NOTE — Progress Notes (Signed)
    Daily Group Progress Note  Program: CD-IOP   11/14/2018 Shane Gilbert [id number]  Diagnosis:  Cocaine use disorder, severe, dependence ([name])  Alcohol use disorder, severe, dependence ([name])  Family history of drug addiction  Family hx of alcoholism  Current smoker   Sobriety Date: 10/29/2018  Group Time: 1-2:30pm  Participation Level: Active  Behavioral Response: Sharing  Type of Therapy: Process Group  Interventions: Supportive  Topic: Process: The first part of group began with process. Group members shared their experiences in early recovery since we last met on Monday. One new group member was present and introduced himself to the group. One group member was absent. Three drug tests were collected.      Group Time: 2:30-4pm  Participation Level: Active  Behavioral Response: Sharing  Type of Therapy: Psycho-education Group  Interventions: Meditation: Mindfulness  Topic: Psychoed: Practicing Mindfulness; The second half of group was spent in psycho-ed discussing less commonly known ways to practice mindfulness. Group members were each given a piece of Hershey's chocolate and instructed to slowly savor the aroma of the chocolate, let the chocolate melt in their mouths, and really focus on the experience of having a piece of chocolate. Group counselors and members processed how mindfulness can be incorporated into eating. Next, group members were lead in a guided imagery exercise to practice the mindfulness skill of creating a "happy" place. Afterwards, group members were lead in a mindful coloring exercise where they were instructed to practice thinking about the colors they used and the choices they had in creating a work of art.     Summary: The patient reported that he attended three NA meetings since we last met on Monday. A drug test was collected from the patient. The patient shared that his older brother, whom he is estranged with, was recently released  from prison and had overdosed on heroin. The patient processed what it is like not knowing where his brother is, whether he is in [place] on the streets, or still recovering in a hospital. The patient also processed the fear of telling his older brother to "follow him" in recovery. The patient shared he is shared of that level of responsibility and vulnerability. The patient referred to reconnecting with his older brother as "opening up a can of worms." The patient shared about how his brother and himself had very different upbringings. The patient was raised by his grandparents. His older brother was raised by his mom. There was a lot instability in his upbringing due their mother not being the most present parent figure. The patient also shared that he attended an [place] meeting after group on Monday at [place]. On Tuesday, the patient shared that he ran errands with his grandma and attended the last call meeting at 11:00 pm. Today before group, the patient went to an NA meeting and talked about a potential sponsor he met named Shane Gilbert. The patient also shared he is doing the 90 meetings in 90 days challenge. During the psycho-ed portion of group, the patient shared that he liked doing the mindful eating exercise, felt relaxed during the guided imagery, and was inspired to create his own lines to color during the mindful coloring exercise.     UDS collected: Yes   AA/NA attended?: Yes Monday, Tuesday, Wednesday before group  Sponsor?: No   [name], LCAS 11/14/2018 8:29 PM

## 2018-11-14 NOTE — Progress Notes (Signed)
CD-IOP INDIVIDUAL SESSION  THERAPIST PROGRESS NOTE  Session Time: 12-1  Participation Level: Active  Behavioral Response: Neat and Well GroomedAlertEuthymic  Type of Therapy: Individual Therapy  Treatment Goals addressed: Communication: Assertiveness, Self Care  Interventions: CBT  Summary: Shane Gilbert is a 27 y.o. male who presents with Cocaine Use Disorder, Severe and Alcohol Use Disorder, Severe.  He appears calm, lucid, and congruent in individual session. he laughs and smiles frequently.  He shares about his new jobs and transitioning and learning a new job culture. Pt discusses his hx of "always being the guy who likes to intervene during conflict". Pt reports feeling uncomfortable around silence and misunderstandings. Pt described a situation last night in which he answer a phone call from an acquaintance in recovery and it annoyed him. Counselor discussed assertiveness and self care skills.  Suicidal/Homicidal: Nowithout intent/plan  Therapist Response: Pt is active, engaged, and excited about recovery. He romanticizes his old using times saying he "was not like other addicts since he kept it mostly fun". Pt is active in NA but states he does not feel connected yet. Pt's main strategy right now is staying busy.   Plan: Continue in CD-IOP for 6 weeks.   Diagnosis:    ICD-10-CM   1. Cocaine use disorder, severe, dependence (HCC) F14.20   2. Alcohol use disorder, severe, dependence (HCC) F10.20        Shane Gilbert, LCAS-A 11/14/2018

## 2018-11-14 NOTE — Addendum Note (Signed)
Addended by: Court JoyKOBER, CHARLES E on: 11/14/2018 04:52 PM   Modules accepted: Orders, Level of Service

## 2018-11-14 NOTE — Progress Notes (Signed)
BH MD/PA/NP OP Progress Note  11/14/2018 4:39 PM CHRISTOPHR CALIX  MRN:  [id number]  Chief Complaint:  Chief Complaint    Drug Problem; Alcohol Problem     HPI: CD IOP Provider FU  1st visit S/P Initial Assessment 10/29/2018 .  Patient was prescribed MAT combination of Baclofen and Naltrexone for cravings.Reprts slight sense of fatigue from Baclofen which is lessening with use. He reports no cravings/obsession to use now, just fleeting thoughts he is able to process quickly. Has new car and is mobile now which he noticed does bring thoughts to mind.He has learned to "play the tape thru to the end" remembering his last episode of use that led him to seek help. He has no desire to return to that place. He reports his sleep is returning to normal and he hasnt needed the Trazodone rx. He forgot to review video BREAKING THE ADDICTIVE CYCLE but will do so this week.  Visit Diagnosis:    ICD-10-CM   1. Cocaine use disorder, severe, dependence (HCC) F14.20   2. Alcohol use disorder, severe, dependence (HCC) F10.20   3. Family history of drug addiction Z81.3   4. Family hx of alcoholism Z81.1   5. 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 Substance induced mood disorder (HCC) F19.94   7. 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 Encounter for monitoring of patient compliance in drug treatment program Z51.89    Allergies: No Known Allergies  Metabolic Disorder Labs: No results found for: HGBA1C, MPG No results found for: PROLACTIN No results found for: CHOL, TRIG, HDL, CHOLHDL, VLDL, LDLCALC No results found for: TSH   UDS-clean 11/12/2018`````````````````````````````````````````````````  Current Medications: Current Outpatient Medications  Medication Sig Dispense Refill  . baclofen (LIORESAL) 10 MG tablet Take 1 tablet (10 mg total) by mouth 3 (three) times daily. 90 tablet 2  . dicyclomine (BENTYL) 20 MG tablet Take 1 tablet (20 mg total) by mouth 2 (two) times  daily. 20 tablet 0  . naltrexone (DEPADE) 50 MG tablet Take 1 tablet (50 mg total) by mouth daily for 180 doses. 90 tablet 1  . polyethylene glycol (MIRALAX) packet Take 17 g by mouth 3 (three) times daily. For 2-3 days or until you have good bowel evacuation 9 each 0  . traZODone (DESYREL) 50 MG tablet Take 1 tablet (50 mg total) by mouth at bedtime as needed and may repeat dose one time if needed for sleep. 60 tablet 2   No current facility-administered medications for this visit.      Musculoskeletal: Strength & Muscle Tone: within normal limits Gait & Station: normal Patient leans: N/A  Psychiatric Specialty Exam: Review of Systems  Constitutional: Positive for malaise/fatigue (per HPI Baclofen related). Negative for chills, diaphoresis, fever and weight loss.  Gastrointestinal: Negative for abdominal pain, blood in stool, constipation, diarrhea, heartburn, melena, nausea and vomiting.  Musculoskeletal: Negative for back pain, falls, joint pain, myalgias and neck pain.  Skin: Negative for itching and rash.  Neurological: Negative for dizziness, tingling, tremors, sensory change, speech change, focal weakness, seizures, loss of consciousness, weakness and headaches.  Psychiatric/Behavioral: Positive for depression and substance abuse. The patient is nervous/anxious and has insomnia.        All improving    There were no vitals taken for this visit.There is no height or weight on file to calculate BMI.  General Appearance: Casual and Neat  Eye Contact:  Good  Speech:  Clear and Coherent and Normal Rate  Volume:  Normal  Mood:  Euthymic  Affect:  Congruent and Full Range  Thought Process:  Coherent and Descriptions of Associations: Intact  Orientation:  Full (Time, Place, and Person)  Thought Content: WDL and Logical   Suicidal Thoughts:  No  Homicidal Thoughts:  No  Memory:  Negative  Judgement:  Other:  Improving  Insight:  Improving  Psychomotor Activity:  Normal   Concentration:  Concentration: Good and Attention Span: Good  Recall:  Good except for blackouts  Fund of Knowledge: WDL  Language: WDL  Akathisia:  Negative  Handed:  Right  AIMS (if indicated): NA  Assets:  Communication Skills Desire for Improvement Financial Resources/Insurance Housing Physical Health Resilience Talents/Skills Transportation  ADL's:  Intact  Cognition: Impaired,  Mild substance induced /early recovery  Sleep:  Improving per HPI   Screenings: AUDIT     Counselor from 11/01/2018 in BEHAVIORAL HEALTH OUTPATIENT THERAPY [place]  Alcohol Use Disorder Identification Test Final Score (AUDIT)  37    GAD-7     Counselor from 11/01/2018 in BEHAVIORAL HEALTH OUTPATIENT THERAPY [place]  Total GAD-7 Score  10    PHQ2-9     Counselor from 11/01/2018 in BEHAVIORAL HEALTH OUTPATIENT THERAPY [place]  PHQ-2 Total Score  5  PHQ-9 Total Score  16     Assessment :      and Plan: No Change FU 2 weeks-sooner if needed   Maryjean Mornharles Angie Hogg, PA-C 11/14/2018, 4:39 PM

## 2018-11-15 ENCOUNTER — Other Ambulatory Visit (HOSPITAL_COMMUNITY): Payer: BLUE CROSS/BLUE SHIELD | Admitting: Psychology

## 2018-11-15 DIAGNOSIS — F1420 Cocaine dependence, uncomplicated: Secondary | ICD-10-CM | POA: Diagnosis not present

## 2018-11-15 DIAGNOSIS — Z813 Family history of other psychoactive substance abuse and dependence: Secondary | ICD-10-CM

## 2018-11-15 DIAGNOSIS — F1020 Alcohol dependence, uncomplicated: Secondary | ICD-10-CM

## 2018-11-19 ENCOUNTER — Encounter (HOSPITAL_COMMUNITY): Payer: Self-pay | Admitting: Psychology

## 2018-11-19 ENCOUNTER — Other Ambulatory Visit (HOSPITAL_COMMUNITY): Payer: BLUE CROSS/BLUE SHIELD | Admitting: Psychology

## 2018-11-19 ENCOUNTER — Encounter: Payer: Self-pay | Admitting: Medical

## 2018-11-19 DIAGNOSIS — F1994 Other psychoactive substance use, unspecified with psychoactive substance-induced mood disorder: Secondary | ICD-10-CM

## 2018-11-19 DIAGNOSIS — F1420 Cocaine dependence, uncomplicated: Secondary | ICD-10-CM

## 2018-11-19 DIAGNOSIS — F1020 Alcohol dependence, uncomplicated: Secondary | ICD-10-CM

## 2018-11-19 NOTE — Progress Notes (Addendum)
    Daily Group Progress Note  Program: CD-IOP   11/19/2018 Shane Gilbert [id number]  Diagnosis: alcohol use disorder, severe   Sobriety Date: 11/4  Group Time: 1-2:30pm  Participation Level: Active  Behavioral Response: Sharing  Type of Therapy: Process Group  Interventions: Supportive  Topic: Process: the first half of group was spent in process. Members shared about any insights, realizations or challenges they had experienced since we last met. members discussed their sponsors, the topics of the 12-step meetings they had attended and the details of their interpersonal relationships. One member was absent today, but she assured the counselor she would recommit on Monday.   Group Time: 2:30-4pm  Participation Level: Active  Behavioral Response: Sharing  Type of Therapy: Psycho-education Group  Interventions: Psychosocial Skills: Grounding  Topic: Psycho-ed: Grounding/Upcoming Holiday. The second half of group began with an introduction to the practice of 'Grounding'. A handout accompanied the session. The counselor guided members through mental, physical and soothing grounding exercises. The session progressed with another handout and discussion on the upcoming holiday. Members were asked to identify some of their memories of past holidays as well as expectations for the upcoming Thanksgiving holiday. Almost every group member experienced anxiety and expressed discomfort in their expectations of the upcoming holidays.   Summary: The patient reported he had worked at [employer] last night and today he received a call from [place] that he would be starting this tomorrow. He has gotten two jobs in less than a week and the group applauded this news. The patient reported the bummer was that 'my car died today'. He was on his way to an NA meeting when it stopped running. His grandfather came and followed him back to the house once they got it started again. His grandfather  phoned his Dealer, but he cannot get to it until the week after Thanksgiving. The patient appeared very disheartened and noted, "I guess it's back to bumming rides". He admitted he had really enjoyed the freedom getting his car back had given him, but it was short-lived. The patient reported 'I'm feeling down'. In the psycho-ed, the patient reported he feels depressed because "I don't believe in Christmas and his family kind of 'pick on me' because of it. The patient admitted he had loved Christmas when he was a little kid and noted the warm good smells and great food. He was encouraged to reflect on his family and their time together. The counselor reminded him that he has not been emotionally available for a long time, but perhaps it can be different this year. He agreed to consider this. The patient has done really well in early recovery and been very enthusiastic. He appears to be on somewhat of a 'pink cloud', but hopefully he can remain vigilant and committed to the Fellowship of NA and his sobriety. He responded well to this intervention.   UDS collected: No   AA/NA attended?: No  Sponsor?: Yes   [name], LCAS 11/19/2018 9:29 AM

## 2018-11-20 ENCOUNTER — Encounter (HOSPITAL_COMMUNITY): Payer: Self-pay | Admitting: Psychology

## 2018-11-20 NOTE — Progress Notes (Signed)
    Daily Group Progress Note  Program: CD-IOP   11/20/2018 Shane Gilbert [id number]  Diagnosis:  Cocaine use disorder, severe, dependence (HCC)  Alcohol use disorder, severe, dependence ([place])  Substance induced mood disorder ([place])   Sobriety Date: 11/23  Group Time: 1-2:30  Participation Level: Active  Behavioral Response: Appropriate and Sharing  Type of Therapy: Process Group  Interventions: CBT and Motivational Interviewing  Topic:  Pts were active and engaged in group processing session. Counselor facilitated CBT-based processing questions to help pts discuss their recovery from mind-altering drugs/alcohol. Emphasis was placed on pts disclosing their challenges and successes pertaining to their treatment plan goals. One pt had relapsed on Cocaine and alcohol over weekend. One pt arrived 1 hour late. UDS results were returned to some members and new samples were collected from some members. Three members met w/ Medical Director for f/u and discussion of MAT.       Group Time: 2:30-4  Participation Level: Active  Behavioral Response: Appropriate and Sharing  Type of Therapy: Psycho-education Group  Interventions: CBT and Psychosocial Skills: Self Compassion  Topic: Patient were active and engaged in psychoeducation session on topic of Self Compassion. PTs watched a 20 min TED talk by Marilynn Latino, a psychologist who researches differences between self-esteem and self compassion. Pts were led in a discussion of benefits for self compassion in recovery. Pts were then given a handout on learning to practice self compassion and led by counselor in this 5 min exercise. Pts reported the exercise was challenging and they would need more practice.    Summary: Pt was remorseful, somber, and looked down for most of session. He reported he relapsed last Friday night on cocaine and alcohol upon leaving his work and "wanting to go party". Pt reported feeling compelled to use  that night despite trying to challenge the thoughts of using. Pt awoke Saturday morning and attended NA and felt remorseful about using. Pt was provided w/ psychoeducation about relapse being a part of recovery. Pt was instructed on ways to learn from mistakes of relapsing. Pt felt committed to sobriety and stated he "knows he does not want to go back to his old addicted lifestyle". Pt was encouraged by other group members who shared their stories of relapsing in the past attempts to get sober. Pt practiced self-compassion exercise actively and reported it helped him feel more understanding of his own suffering.   UDS collected: Yes Results: pending  AA/NA attended?: YesFriday and Saturday  Sponsor?: No   [name], LCAS 11/20/2018 10:52 AM

## 2018-11-21 ENCOUNTER — Other Ambulatory Visit (HOSPITAL_COMMUNITY): Payer: BLUE CROSS/BLUE SHIELD | Admitting: Psychology

## 2018-11-21 DIAGNOSIS — F1420 Cocaine dependence, uncomplicated: Secondary | ICD-10-CM | POA: Diagnosis not present

## 2018-11-21 DIAGNOSIS — Z813 Family history of other psychoactive substance abuse and dependence: Secondary | ICD-10-CM

## 2018-11-21 DIAGNOSIS — F1020 Alcohol dependence, uncomplicated: Secondary | ICD-10-CM

## 2018-11-26 ENCOUNTER — Telehealth (HOSPITAL_COMMUNITY): Payer: Self-pay | Admitting: Psychology

## 2018-11-26 ENCOUNTER — Other Ambulatory Visit (HOSPITAL_COMMUNITY): Payer: BLUE CROSS/BLUE SHIELD

## 2018-11-26 ENCOUNTER — Encounter (HOSPITAL_COMMUNITY): Payer: Self-pay | Admitting: Psychology

## 2018-11-26 NOTE — Progress Notes (Signed)
    Daily Group Progress Note  Program: CD-IOP   11/26/2018 Shane Gilbert [id number]  Diagnosis:  Cocaine use disorder, severe, dependence (HCC)  Alcohol use disorder, severe, dependence ([place])  Family history of drug addiction  Family hx of alcoholism  Substance-induced sleep disorder ([place])  Substance induced mood disorder ([place])  Substance-induced anxiety disorder ([place])  Encounter for monitoring of patient compliance in drug treatment program   Sobriety Date: 11/4  Group Time: 1-2:30pm  Participation Level: Active  Behavioral Response: Sharing  Type of Therapy: Process Group  Interventions: Supportive  Topic: Process: The first part of group began with process. Group members shared about their experiences in early recovery since we last met on Monday. One group member was absent. Two drug tests were collected. The Medical Director met with four group members to complete medication checks.  Group Time: 2:30-4pm  Participation Level: Active  Behavioral Response: Sharing  Type of Therapy: Psycho-education Group  Interventions: Meditation: Spirituality  Topic: Psycho-ed: Chaplain; Spirituality: The second half of group was spent in psychoeducation with the Chaplain discussing the importance of spirituality. The Chaplain shared that spirituality is "anything that frees you" and contrasted addiction with spiritually. Addiction is entrapment. The Chaplain shared the three components of spirituality, 1) involvement with helping others 2) practicing your spirituality, and 3) investing in healthy relationships that support spirituality.   Summary:The patient reported that he had attended three NA meetings since we last met on Monday. The patient met with the Medical Director to complete a medication check. The patient reported that on Monday after group, the attended the [place] meeting as stated that it was a "real good" meeting. The patient shared that the speaker  talked about "not holding the seat hostage" and the importance of being active in the fellowship of NA. On Tuesday, the patient shared that he got his haircut, which turned out to be an interesting two-hour experience for him. The patient shared that afterwards, he went to work. After getting off work, the patient spoke to his sister and told her about his addiction and being in early recovery. The patient shared that it was nice to be truthful with her. The patient attended the last call NA meeting on Tuesday night. He described this meeting as the "best worst meeting." Today, the patient attended [facility] before coming to group. The patient shared that he made the [facility] meeting his home group and is searching for a sponsor. During the psycho-ed portion of group, the patient shared about his struggles with alcohol and cocaine. He was active and engaged when the [place] spoke.    UDS collected: Yes Results: negative  AA/NA attended?: YesMonday and Tuesday  Sponsor?: No   [name], LCAS 11/26/2018 9:16 AM

## 2018-11-28 ENCOUNTER — Other Ambulatory Visit (HOSPITAL_COMMUNITY): Payer: BLUE CROSS/BLUE SHIELD

## 2018-11-28 ENCOUNTER — Telehealth (HOSPITAL_COMMUNITY): Payer: Self-pay | Admitting: Psychology

## 2018-11-29 ENCOUNTER — Other Ambulatory Visit (HOSPITAL_COMMUNITY): Payer: BLUE CROSS/BLUE SHIELD

## 2018-11-29 ENCOUNTER — Telehealth (HOSPITAL_COMMUNITY): Payer: Self-pay | Admitting: Psychology

## 2018-11-30 ENCOUNTER — Encounter (HOSPITAL_COMMUNITY): Payer: Self-pay | Admitting: Psychology

## 2018-11-30 NOTE — Progress Notes (Signed)
    Daily Group Progress Note  Program: CD-IOP   11/30/2018 Shane Gilbert [id number]  Diagnosis: alcohol use disorder, severe  Sobriety Date: 11/17/18  Group Time: 1-2:30pm  Participation Level: Active  Behavioral Response: Appropriate  Type of Therapy: Process Group  Interventions: Supportive  Topic: Process: The first part of group began with process. Group members shared their experiences in early recovery since we last met on Monday. One group member was absent because he was traveling to visit family. No drug tests were collected. The Medical Director met with three group members to complete two medication checks and one discharge plan.   Group Time: 2:30-4pm  Participation Level: Active  Behavioral Response: Sharing  Type of Therapy: Psycho-education Group  Interventions: Strength-based  Topic: Psycho-ed: 12 Tips to Stay Sober During the Holidays and Graduation; The second half of group was spent in psycho-ed discussing 12 Tips to Stay [place], a handout provided by Alcoholics Anonymous. The handout was passed out to group members who took turns reading the 12 tips. The group spent time discussing how they could implement the tips into the upcoming holiday weekend. A graduation was held for one of the group members. Group members passed around the graduation medallion, rubbed in things they want the graduate to carry with him when he leaves the group, and rubbed out things they want the graduate to leave behind. The graduate's younger brother and a previous CD-IOP graduate attended the graduation.   Summary: The patient reported that he attended two NA meetings since we last met. The patient reported that Monday after group he went home and talked to his grandmother and grandfather about his relapse. The patient stated that they were "receptive" and that they stated this is a part of his recovery journey. The patient shared that he is feeling more  "self-forgiving" today then he was last time he shared in group. The patient shared that on Tuesday, he got "snappy" with his grandma over her questioning whether or not he needed to go to a meeting. The meeting the patient attended was at [facility]. The patient described this meeting as a "good meeting" and that he acquired a sponsor named Shane Gilbert. On Tuesday, the patient reported that he apologized to his grandma and grandpa for the way he acted. He talked about the respect he has for both his grandparents and how fortunate he is to have them supporting him. The patient talked with sponsor Shane Gilbert on the phone and was told to read chapter 1 of the NA book, and the spiritual principals of the 12 steps. The patient also attended the meeting at [facility]. During the psycho-ed portion of group, the patient shared that he plans to use the AA tip of not staying late at events. The patient shared that in his addiction, he stayed out late and being out late can be a trigger for him to drink.    UDS collected: No Results:   AA/NA attended?: YesMonday and Tuesday  Sponsor?: Yes   [name], LCAS 11/30/2018 12:20 PM

## 2018-12-03 ENCOUNTER — Encounter: Payer: Self-pay | Admitting: Medical

## 2018-12-03 ENCOUNTER — Other Ambulatory Visit (HOSPITAL_COMMUNITY): Payer: BLUE CROSS/BLUE SHIELD | Attending: Psychiatry | Admitting: Psychology

## 2018-12-03 ENCOUNTER — Encounter (HOSPITAL_COMMUNITY): Payer: Self-pay | Admitting: Medical

## 2018-12-03 DIAGNOSIS — F17200 Nicotine dependence, unspecified, uncomplicated: Secondary | ICD-10-CM

## 2018-12-03 DIAGNOSIS — F1420 Cocaine dependence, uncomplicated: Secondary | ICD-10-CM

## 2018-12-03 DIAGNOSIS — F19980 Other psychoactive substance use, unspecified with psychoactive substance-induced anxiety disorder: Secondary | ICD-10-CM

## 2018-12-03 DIAGNOSIS — Z813 Family history of other psychoactive substance abuse and dependence: Secondary | ICD-10-CM

## 2018-12-03 DIAGNOSIS — F19982 Other psychoactive substance use, unspecified with psychoactive substance-induced sleep disorder: Secondary | ICD-10-CM

## 2018-12-03 DIAGNOSIS — Z811 Family history of alcohol abuse and dependence: Secondary | ICD-10-CM

## 2018-12-03 DIAGNOSIS — F1994 Other psychoactive substance use, unspecified with psychoactive substance-induced mood disorder: Secondary | ICD-10-CM

## 2018-12-03 DIAGNOSIS — F1020 Alcohol dependence, uncomplicated: Secondary | ICD-10-CM

## 2018-12-03 DIAGNOSIS — Z79899 Other long term (current) drug therapy: Secondary | ICD-10-CM | POA: Insufficient documentation

## 2018-12-03 NOTE — Progress Notes (Signed)
[facility]                                                                                             CD-IOP DISCHARGE NOTE   Date of Admission: 11/01/2018 Referall Provider:Family Date of Discharge: 12/03/2018 Sobriety Date:None known Admission Diagnosis: CM     1. Cocaine use disorder, severe, dependence (HCC) F14.20   2. Alcohol use disorder, severe, dependence (HCC) F10.20   3. Family history of drug addiction Z81.3   4. Family hx of alcoholism Z81.1   5. Current smoker F17.200   6. 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 He fqailed to continue with program after 11/27.He failed to respond to attempts to reach him by phone;thru his granparents where he was residing for treatment and his Aunt who initiated his contact with the program. His grandparents went away fro Thanks giving and they have not seen nor heard from patient since 11/27.  Medications: . baclofen (LIORESAL) 10 MG tablet Take 1 tablet (10 mg total) by mouth 3 (three) times daily. 90 tablet 2  . dicyclomine (BENTYL) 20 MG tablet Take 1 tablet (20 mg total) by mouth 2 (two) times daily. 20 tablet 0  . naltrexone (DEPADE) 50 MG tablet Take 1 tablet (50 mg total) by mouth daily for 180 doses. 90 tablet 1  . polyethylene glycol (MIRALAX) packet Take 17 g by mouth 3 (three) times daily. For 2-3 days or until you have good bowel evacuation 9 each 0  . traZODone (DESYREL) 50 MG tablet Take 1 tablet (50 mg total) by mouth at bedtime as needed and may repeat dose one time if needed for sleep. 60  tablet 2    Discharge Diagnosis: 0 Cocaine use disorder, severe, dependence (HCC)  0 Alcohol use disorder, severe, dependence (HCC)  0 Family history of drug addiction  0 Family hx of alcoholism  0 Substance induced mood disorder (HCC)  0 Substance-induced sleep disorder (HCC)  0 Substance-induced anxiety disorder (HCC)  0 Current smoker    Plan of Action to Address Continuing Problems: Goals and Activities to Help Maintain Sobriety: 1. Stay away from people ,places and things that are triggers 2. Continue practicing Fair Fighting rules in interpersonal conflicts. 3. Continue alcohol  and drug refusal skills and call on support systems 4. Attend AA/NA meetings AT LEAST as often as you use  5. Obtain a sponsor and a home group in AA/NA. 6. Seek further treatment at higher level  Referrals: Recommend Inpt treatment  Aftercare services: NA  Next appointment: ?  Prognosis:Unknown-Probably not good at this point   Client has NOT participated in the development of this discharge plan and has NOT received a copy of this completed plan

## 2018-12-05 ENCOUNTER — Encounter (HOSPITAL_COMMUNITY): Payer: Self-pay | Admitting: Medical

## 2018-12-05 ENCOUNTER — Emergency Department (HOSPITAL_COMMUNITY)
Admission: EM | Admit: 2018-12-05 | Discharge: 2018-12-05 | Disposition: A | Discharge status: Home / Self Care | Payer: BLUE CROSS/BLUE SHIELD | Attending: Emergency Medicine | Admitting: Emergency Medicine

## 2018-12-05 ENCOUNTER — Other Ambulatory Visit (HOSPITAL_COMMUNITY): Payer: BLUE CROSS/BLUE SHIELD | Admitting: Medical

## 2018-12-05 ENCOUNTER — Encounter (HOSPITAL_COMMUNITY): Payer: Self-pay

## 2018-12-05 ENCOUNTER — Emergency Department (HOSPITAL_COMMUNITY): Payer: BLUE CROSS/BLUE SHIELD

## 2018-12-05 ENCOUNTER — Other Ambulatory Visit: Payer: Self-pay

## 2018-12-05 DIAGNOSIS — F1490 Cocaine use, unspecified, uncomplicated: Secondary | ICD-10-CM | POA: Diagnosis present

## 2018-12-05 DIAGNOSIS — R079 Chest pain, unspecified: Secondary | ICD-10-CM | POA: Diagnosis not present

## 2018-12-05 DIAGNOSIS — Z79899 Other long term (current) drug therapy: Secondary | ICD-10-CM | POA: Insufficient documentation

## 2018-12-05 DIAGNOSIS — Z87891 Personal history of nicotine dependence: Secondary | ICD-10-CM | POA: Insufficient documentation

## 2018-12-05 DIAGNOSIS — R000 Tachycardia, unspecified: Secondary | ICD-10-CM | POA: Diagnosis not present

## 2018-12-05 DIAGNOSIS — R0789 Other chest pain: Secondary | ICD-10-CM | POA: Diagnosis not present

## 2018-12-05 DIAGNOSIS — F1020 Alcohol dependence, uncomplicated: Secondary | ICD-10-CM | POA: Diagnosis not present

## 2018-12-05 DIAGNOSIS — R0902 Hypoxemia: Secondary | ICD-10-CM | POA: Diagnosis not present

## 2018-12-05 DIAGNOSIS — F1410 Cocaine abuse, uncomplicated: Secondary | ICD-10-CM

## 2018-12-05 DIAGNOSIS — R4182 Altered mental status, unspecified: Secondary | ICD-10-CM | POA: Diagnosis not present

## 2018-12-05 LAB — BASIC METABOLIC PANEL
Anion gap: 11 (ref 5–15)
BUN: 11 mg/dL (ref 6–20)
CHLORIDE: 101 mmol/L (ref 98–111)
CO2: 23 mmol/L (ref 22–32)
Calcium: 9.6 mg/dL (ref 8.9–10.3)
Creatinine, Ser: 1.07 mg/dL (ref 0.61–1.24)
GFR calc non Af Amer: >60 mL/min (ref >60)
Glucose, Bld: 91 mg/dL (ref 70–99)
Potassium: 4.6 mmol/L (ref 3.5–5.1)
Sodium: 135 mmol/L (ref 135–145)

## 2018-12-05 LAB — POCT I-STAT TROPONIN I: TROPONIN I, POC: 0.01 ng/mL (ref 0.00–0.08)

## 2018-12-05 LAB — CBC
HCT: 48.9 % (ref 39.0–52.0)
Hemoglobin: 16.3 g/dL (ref 13.0–17.0)
MCH: 27.1 pg (ref 26.0–34.0)
MCHC: 33.3 g/dL (ref 30.0–36.0)
MCV: 81.4 fL (ref 80.0–100.0)
PLATELETS: 286 10*3/uL (ref 150–400)
RBC: 6.01 MIL/uL — ABNORMAL HIGH (ref 4.22–5.81)
RDW: 11.9 % (ref 11.5–15.5)
WBC: 6.4 10*3/uL (ref 4.0–10.5)
nRBC: 0 % (ref 0.0–0.2)

## 2018-12-05 LAB — I-STAT TROPONIN, ED: Troponin i, poc: 0.01 ng/mL (ref 0.00–0.08)

## 2018-12-05 NOTE — Discharge Instructions (Signed)
Follow up with behavioral Health

## 2018-12-05 NOTE — ED Provider Notes (Signed)
[facility]-EMERGENCY DEPT Provider Note   CSN: [id number] Arrival date & time: 12/05/18  1518     History   Chief Complaint Chief Complaint  Patient presents with  . Drug Problem    HPI Shane Gilbert is a 27 y.o. male.  The history is provided by the patient. No language interpreter was used.  Drug Problem  This is a new problem. The current episode started 3 to 5 hours ago. The problem occurs constantly. The problem has been gradually improving. Associated symptoms include chest pain. Nothing aggravates the symptoms. Nothing relieves the symptoms. He has tried nothing for the symptoms.  Pt reports he has been on a bender.  Pt reports he used a large amount of cocaine. Pt reports he thinks he got hold of something bad.  Pt reports he began having chest pain.    History reviewed. No pertinent past medical history.  Patient Active Problem List   Diagnosis Date Noted  . History of crack cocaine use 11/05/2018  . Cocaine use disorder, severe, dependence (HCC) 11/05/2018  . Family history of drug addiction 11/05/2018  . Family hx of alcoholism 11/05/2018  . Current smoker 11/05/2018  . Alcohol use disorder, severe, dependence (HCC) 11/01/2018    Past Surgical History:  Procedure Laterality Date  . ESOPHAGOGASTRODUODENOSCOPY  11/15/2011   Procedure: ESOPHAGOGASTRODUODENOSCOPY (EGD);  Surgeon: Rob Bunting, MD;  Location: [facility] OR;  Service: Gastroenterology;  Laterality: N/A;        Home Medications    Prior to Admission medications   Medication Sig Start Date End Date Taking? Authorizing Provider  baclofen (LIORESAL) 10 MG tablet Take 1 tablet (10 mg total) by mouth 3 (three) times daily. 11/05/18 11/05/19  Court Joy, PA-C  dicyclomine (BENTYL) 20 MG tablet Take 1 tablet (20 mg total) by mouth 2 (two) times daily. 12/07/15   Elpidio Anis, PA-C  naltrexone (DEPADE) 50 MG tablet Take 1 tablet (50 mg total) by mouth daily for 180 doses.  11/05/18 05/04/19  Court Joy, PA-C  polyethylene glycol [facility]) packet Take 17 g by mouth 3 (three) times daily. For 2-3 days or until you have good bowel evacuation 12/07/15   Elpidio Anis, PA-C    Family History No family history on file.  Social History Social History   Tobacco Use  . Smoking status: Former Games developer  . Smokeless tobacco: Never Used  Substance Use Topics  . Alcohol use: Yes    Comment: social  . Drug use: Yes    Types: Marijuana, Cocaine     Allergies   Patient has no known allergies.   Review of Systems Review of Systems  Cardiovascular: Positive for chest pain.  All other systems reviewed and are negative.    Physical Exam Updated Vital Signs BP (!) 156/100 (BP Location: Left Arm)   Pulse 90   Temp 97.8 F (36.6 C) (Oral)   Resp 16   Ht 5\' 8"  (1.727 m)   Wt 72.6 kg   SpO2 100%   BMI 24.33 kg/m   Physical Exam  Constitutional: He appears well-developed and well-nourished.  HENT:  Head: Normocephalic and atraumatic.  Eyes: Conjunctivae are normal.  Neck: Neck supple.  Cardiovascular: Normal rate and regular rhythm.  No murmur heard. Pulmonary/Chest: Effort normal and breath sounds normal. No respiratory distress.  Abdominal: Soft. There is no tenderness.  Musculoskeletal: He exhibits no edema.  Neurological: He is alert.  Skin: Skin is warm and dry.  Psychiatric: He has a  normal mood and affect.  Nursing note and vitals reviewed.    ED Treatments / Results  Labs (all labs ordered are listed, but only abnormal results are displayed) Labs Reviewed  CBC - Abnormal; Notable for the following components:      Result Value   RBC 6.01 (*)    All other components within normal limits  BASIC METABOLIC PANEL  I-STAT TROPONIN, ED  POCT I-STAT TROPONIN I  I-STAT TROPONIN, ED    EKG EKG Interpretation  Date/Time:  Wednesday December 05 2018 17:32:52 EST Ventricular Rate:  94 PR Interval:    QRS Duration: 87 QT  Interval:  317 QTC Calculation: 397 R Axis:   80 Text Interpretation:  Sinus rhythm Anteroseptal infarct, old No ST elevations or depressions Non-specific ST-t changes Confirmed by Shaune PollackIsaacs, Cameron [id number]) on 12/05/2018 5:45:59 PM   Radiology Dg Chest 2 View  Result Date: 12/05/2018 CLINICAL DATA:  Altered mental status with questionable drug overdose EXAM: CHEST - 2 VIEW COMPARISON:  April 20, 2005 FINDINGS: Lungs are clear. Heart size and pulmonary vascularity are normal. No adenopathy. No bone lesions. No pneumothorax or pneumomediastinum. IMPRESSION: No edema or consolidation. Electronically Signed   By: Bretta BangWilliam  Woodruff III M.D.   On: 12/05/2018 14:38    Procedures Procedures (including critical care time)  Medications Ordered in ED Medications - No data to display   Initial Impression / Assessment and Plan / ED Course  I have reviewed the triage vital signs and the nursing notes.  Pertinent labs & imaging results that were available during my care of the patient were reviewed by me and considered in my medical decision making (see chart for details).  Clinical Course as of Dec 05 1816  Wed Dec 05, 2018  1734 ED EKG within 10 minutes [LS]    Clinical Course User Index [LS] Elson AreasSofia, Carolee Channell K, [place]PA-C    MDM  Ekg nonacute, Chest xray normal.  Troponin negative x 2.  Pt is pain free.   Pt advised he needs to follow up with his Psychiatrist   Final Clinical Impressions(s) / ED Diagnoses   Final diagnoses:  Cocaine abuse (HCC)  Chest pain, unspecified type    ED Discharge Orders    None    An After Visit Summary was printed and given to the patient.    Elson AreasSofia, Francesco Provencal K, PA-C 12/06/18 1253    Shaune PollackIsaacs, Cameron, MD 12/06/18 [phone]

## 2018-12-05 NOTE — ED Notes (Signed)
Pt ambulatory to xray.
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
## 2018-12-05 NOTE — Progress Notes (Signed)
Patient ID: [name], male   DOB: Feb 07, 1991, 27 y.o.   MRN: [id number] Pt failed to come to group-relapsed at thanksgiving-came Monday but now absent again

## 2018-12-06 ENCOUNTER — Other Ambulatory Visit (HOSPITAL_COMMUNITY): Payer: BLUE CROSS/BLUE SHIELD | Admitting: Psychology

## 2018-12-06 DIAGNOSIS — Z813 Family history of other psychoactive substance abuse and dependence: Secondary | ICD-10-CM

## 2018-12-06 DIAGNOSIS — F1020 Alcohol dependence, uncomplicated: Secondary | ICD-10-CM

## 2018-12-06 DIAGNOSIS — F1420 Cocaine dependence, uncomplicated: Secondary | ICD-10-CM

## 2018-12-07 ENCOUNTER — Encounter (HOSPITAL_COMMUNITY): Payer: Self-pay | Admitting: Psychology

## 2018-12-10 ENCOUNTER — Telehealth (HOSPITAL_COMMUNITY): Payer: Self-pay | Admitting: Psychology

## 2018-12-10 ENCOUNTER — Other Ambulatory Visit (HOSPITAL_COMMUNITY): Payer: BLUE CROSS/BLUE SHIELD | Admitting: Psychology

## 2018-12-10 ENCOUNTER — Encounter: Payer: Self-pay | Admitting: Medical

## 2018-12-10 DIAGNOSIS — F19980 Other psychoactive substance use, unspecified with psychoactive substance-induced anxiety disorder: Secondary | ICD-10-CM | POA: Diagnosis not present

## 2018-12-10 DIAGNOSIS — F1420 Cocaine dependence, uncomplicated: Secondary | ICD-10-CM | POA: Diagnosis present

## 2018-12-10 DIAGNOSIS — Z811 Family history of alcohol abuse and dependence: Secondary | ICD-10-CM | POA: Diagnosis not present

## 2018-12-10 DIAGNOSIS — F1020 Alcohol dependence, uncomplicated: Secondary | ICD-10-CM

## 2018-12-10 DIAGNOSIS — Z813 Family history of other psychoactive substance abuse and dependence: Secondary | ICD-10-CM | POA: Diagnosis not present

## 2018-12-10 DIAGNOSIS — F1994 Other psychoactive substance use, unspecified with psychoactive substance-induced mood disorder: Secondary | ICD-10-CM | POA: Diagnosis not present

## 2018-12-10 DIAGNOSIS — F17200 Nicotine dependence, unspecified, uncomplicated: Secondary | ICD-10-CM | POA: Diagnosis not present

## 2018-12-10 DIAGNOSIS — F19982 Other psychoactive substance use, unspecified with psychoactive substance-induced sleep disorder: Secondary | ICD-10-CM | POA: Diagnosis not present

## 2018-12-10 DIAGNOSIS — Z79899 Other long term (current) drug therapy: Secondary | ICD-10-CM | POA: Diagnosis not present

## 2018-12-12 ENCOUNTER — Encounter (HOSPITAL_COMMUNITY): Payer: Self-pay | Admitting: Psychology

## 2018-12-12 ENCOUNTER — Other Ambulatory Visit (HOSPITAL_COMMUNITY): Payer: BLUE CROSS/BLUE SHIELD

## 2018-12-12 ENCOUNTER — Telehealth (HOSPITAL_COMMUNITY): Payer: Self-pay | Admitting: Psychology
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
## 2018-12-13 ENCOUNTER — Other Ambulatory Visit (HOSPITAL_COMMUNITY): Payer: BLUE CROSS/BLUE SHIELD

## 2018-12-13 ENCOUNTER — Telehealth (HOSPITAL_COMMUNITY): Payer: Self-pay | Admitting: Licensed Clinical Social Worker

## 2018-12-13 NOTE — Telephone Encounter (Signed)
Called pt back after pt called earlier today to tell us he will not be continuing CD-IOP. Pt was not home and clinician spoke w/ grandmother. Ask for pt to call back and discuss recommendations for d/c planning.

## 2018-12-14 ENCOUNTER — Encounter (HOSPITAL_COMMUNITY): Payer: Self-pay | Admitting: Psychology

## 2018-12-14 NOTE — Progress Notes (Signed)
Shane Gilbert is a 27 y.o. male patient. CD-IOP: Individual Counseling Session: The patient arrived on time for his counseling appointment. He appeared alert and orientated. The session began with talking to the patient about dialing back the activities he is involved in. The counselors pointed out that he overloaded himself by working two jobs, attending CD-IOP, and going to meetings. The patient shared that he quit his job at [employer] and is now only working one job. The counselors processed the importance of building a schedule in early recovery and sticking to it. With a counselor, the patient created a tentative day-to-day schedule for him to follow. The patient is still feeling ashamed of his use and overdose. The patient reported that he has not yet called his sponsor to tell him about his relapse. The counselors provided the patient with a copy of his urine analysis. Hydrocodone was found in the patient's system. The patient reported that he did not knowingly take any opioids. This means it was likely that the cocaine the patient used was laced. The patient stated that "explains" his overdose. The counselors explained to the patient the importance of consistent attendance in the program, providing negative urine screenings, and going to NA. The patient agreed to these terms and signed a behavioral contract outlining these terms. The patient will begin seeing a new individual counselor for the rest of his duration in the program. Future sessions will focus on helping the client remain abstinent from drugs and alcohol and building a recovery support system. He was receptive to recommendations and responded well to this intervention.  Trudie Bucklerachel Swaim, Counselor Intern         Charmian MuffAnn Makyi Ledo, LCAS

## 2018-12-14 NOTE — Progress Notes (Signed)
    Daily Group Progress Note  Program: CD-IOP   12/14/2018 Shane Gilbert [id number]  Diagnosis: Alcohol use disorder, severe, cocaine use disorder, severe  Sobriety Date: 12/06/18  Group Time: 1-2:30pm  Participation Level: Active  Behavioral Response: Sharing  Type of Therapy: Process Group  Interventions: Supportive  Topic: Process: The first part of group began with process. Group members shared their experiences in early recovery since we last met on Wednesday. Two drug tests were collected. On group member arrived late while another group member was absent.   Group Time: 2:30-4pm  Participation Level: Minimal  Behavioral Response: Sharing  Type of Therapy: Psycho-education Group  Interventions: CBT  Topic: Psycho-ed: [name] of Addiction; the second half of group was spent in psycho-ed discussing the [name] of Addiction. A handout of the [name] was passed out to group members who were instructed to re-create their own progression curve of addiction on a separate sheet of white paper. After creating their own curves, group members came back together to discuss their drawings and share what this process was like for them.  Summary: The patient arrived approximately forty-five minutes late to group. The patient reported that he had not attended any meetings since we last met on Wednesday. The patient shared that the reason he did not attend group yesterday because he overdosed on cocaine. He reported, "I overdosed yesterday."  The patient shared that he passed out in his car. When asked about how he is feeling, he stated "better now than yesterday." The patient will not be charged for attended this session. He was quiet and withdrawn. He did not shared much in this session. During the psycho-ed portion of group, the patient recreated his [name]. The patient used colors to represent his addiction becoming "darker." The patient shared about  causally using with friends after school to using every day. 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 The patient reported that he began using cocaine around friends from work. The patient was very quiet and withdrawn for much of the session. We will continue to follow closely in the days ahead.   UDS collected: Yes Results: Positive for cocaine and opiates  AA/NA attended?: No  Sponsor?: Yes   [name], LCAS 12/14/2018 2:29 PM

## 2018-12-17 ENCOUNTER — Other Ambulatory Visit (INDEPENDENT_AMBULATORY_CARE_PROVIDER_SITE_OTHER): Payer: BLUE CROSS/BLUE SHIELD | Admitting: Medical

## 2018-12-17 ENCOUNTER — Encounter (HOSPITAL_COMMUNITY): Payer: Self-pay | Admitting: Medical

## 2018-12-17 DIAGNOSIS — F1994 Other psychoactive substance use, unspecified with psychoactive substance-induced mood disorder: Secondary | ICD-10-CM

## 2018-12-17 DIAGNOSIS — Z811 Family history of alcohol abuse and dependence: Secondary | ICD-10-CM

## 2018-12-17 DIAGNOSIS — F1420 Cocaine dependence, uncomplicated: Secondary | ICD-10-CM

## 2018-12-17 DIAGNOSIS — Z5329 Procedure and treatment not carried out because of patient's decision for other reasons: Secondary | ICD-10-CM

## 2018-12-17 DIAGNOSIS — F17200 Nicotine dependence, unspecified, uncomplicated: Secondary | ICD-10-CM

## 2018-12-17 DIAGNOSIS — Z813 Family history of other psychoactive substance abuse and dependence: Secondary | ICD-10-CM | POA: Diagnosis not present

## 2018-12-17 DIAGNOSIS — F1020 Alcohol dependence, uncomplicated: Secondary | ICD-10-CM

## 2018-12-17 DIAGNOSIS — F19980 Other psychoactive substance use, unspecified with psychoactive substance-induced anxiety disorder: Secondary | ICD-10-CM

## 2018-12-17 DIAGNOSIS — F19982 Other psychoactive substance use, unspecified with psychoactive substance-induced sleep disorder: Secondary | ICD-10-CM

## 2018-12-17 NOTE — Progress Notes (Signed)
[facility]                                                                                             CD-IOP DISCHARGE NOTE   Date of Admission: 11/01/2017 Referall Provider:Family Date of Discharge: 12/13/2018 Sobriety Date: None-doesnt have one Admission Diagnosis: 1. Cocaine use disorder, severe, dependence (HCC) F14.20   2. Alcohol use disorder, severe, dependence (HCC) F10.20   3. Family history of drug addiction Z81.3   4. Family hx of alcoholism Z81.1   5. Current smoker F17.200   6. 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  Medications:  . baclofen (LIORESAL) 10 MG tablet Take 1 tablet (10 mg total) by mouth 3 (three) times daily. 90 tablet 2  . dicyclomine (BENTYL) 20 MG tablet Take 1 tablet (20 mg total) by mouth 2 (two) times daily. 20 tablet 0   . naltrexone (DEPADE) 50 MG tablet Take 1 tablet (50 mg total) by mouth daily for 180 doses. 90 tablet 1   . polyethylene glycol (MIRALAX) packet Take 17 g by mouth 3 (three) times daily. For 2-3 days or until you have good bowel evacuation 9 each 0   . traZODone (DESYREL) 50 MG tablet Take 1 tablet (50 mg total) by mouth at bedtime as needed and may repeat dose one time if needed for sleep. 60  tablet 2     Discharge Diagnosis: 0 Cocaine use disorder, severe, dependence (HCC)  0 Alcohol use disorder, severe, dependence (HCC)  0 Family history of drug addiction  0 No-show for appointment  0 Family hx of alcoholism  0 Substance induced mood disorder (HCC)  0 Substance-induced sleep disorder (HCC)  0 Substance-induced anxiety disorder (HCC)  0 Current smoker    Plan of Action to Address Continuing Problems:  Goals and Activities to Help Maintain Sobriety: 1. Stay away from people ,places and things that are triggers 2. Continue practicing Fair Fighting rules in interpersonal conflicts. 3. Continue  alcohol and drug refusal skills and call on support systems 4. Attend AA/NA meetings AT LEAST as often as you use  5. Obtain a sponsor and a home group in AA/NA. 6. Seek higher level of care at residential treatment center of choice  [facility]  Next appointment: none scheduled-pt failed to return to group  Prognosis:Poor at present    Client has NOT participated in the development of this discharge plan and has NOT received a copy of this completed plan

## 2019-05-11 ENCOUNTER — Encounter (HOSPITAL_COMMUNITY): Payer: Self-pay | Admitting: Emergency Medicine

## 2019-05-11 ENCOUNTER — Other Ambulatory Visit: Payer: Self-pay

## 2019-05-11 ENCOUNTER — Emergency Department (HOSPITAL_COMMUNITY)
Admission: EM | Admit: 2019-05-11 | Discharge: 2019-05-11 | Disposition: A | Discharge status: Home / Self Care | Payer: BLUE CROSS/BLUE SHIELD | Attending: Emergency Medicine | Admitting: Emergency Medicine

## 2019-05-11 ENCOUNTER — Emergency Department (HOSPITAL_COMMUNITY): Payer: BLUE CROSS/BLUE SHIELD

## 2019-05-11 DIAGNOSIS — S022XXA Fracture of nasal bones, initial encounter for closed fracture: Secondary | ICD-10-CM | POA: Insufficient documentation

## 2019-05-11 DIAGNOSIS — Y929 Unspecified place or not applicable: Secondary | ICD-10-CM | POA: Insufficient documentation

## 2019-05-11 DIAGNOSIS — S0010XA Contusion of unspecified eyelid and periocular area, initial encounter: Secondary | ICD-10-CM | POA: Insufficient documentation

## 2019-05-11 DIAGNOSIS — Z87891 Personal history of nicotine dependence: Secondary | ICD-10-CM | POA: Insufficient documentation

## 2019-05-11 DIAGNOSIS — Y939 Activity, unspecified: Secondary | ICD-10-CM | POA: Insufficient documentation

## 2019-05-11 DIAGNOSIS — Y999 Unspecified external cause status: Secondary | ICD-10-CM | POA: Insufficient documentation

## 2019-05-11 DIAGNOSIS — Z79899 Other long term (current) drug therapy: Secondary | ICD-10-CM | POA: Insufficient documentation

## 2019-05-11 DIAGNOSIS — Z23 Encounter for immunization: Secondary | ICD-10-CM | POA: Insufficient documentation

## 2019-05-11 MED ORDER — MELOXICAM 15 MG PO TABS: 15.0000 mg | ORAL_TABLET | Freq: Every day | ORAL | 0 refills | Status: DC

## 2019-05-11 MED ORDER — TETANUS-DIPHTH-ACELL PERTUSSIS 5-2.5-18.5 LF-MCG/0.5 IM SUSP
0.5000 mL | Freq: Once | INTRAMUSCULAR | Status: AC
  Administered 2019-05-11: 0.5 mL via INTRAMUSCULAR
  Filled 2019-05-11: qty 0.5

## 2019-05-11 MED ORDER — CEPHALEXIN 500 MG PO CAPS: ORAL_CAPSULE | ORAL | 0 refills | Status: DC

## 2019-05-11 MED ORDER — IBUPROFEN 800 MG PO TABS
800.0000 mg | ORAL_TABLET | Freq: Once | ORAL | Status: AC
  Administered 2019-05-11: 12:00:00 800 mg via ORAL
  Filled 2019-05-11: qty 1

## 2019-05-11 NOTE — Discharge Instructions (Signed)
Get help right away if: You have bleeding from your nose that does not stop after you pinch your nostrils closed for 20 minutes and keep ice on your nose. You have clear fluid draining out of your nose. You notice swelling near the septum inside the nose. This swelling is a septal hematoma that must be drained to help prevent infection. You have difficulty moving your eyes. You have repeated vomiting. 

## 2019-05-11 NOTE — ED Notes (Signed)
Pts grandmother contacted to pick up pt.

## 2019-05-11 NOTE — ED Notes (Signed)
Pt returned from CT °
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
## 2019-05-11 NOTE — ED Notes (Signed)
Oswaldo Done [place]-[phone]

## 2019-05-11 NOTE — ED Notes (Signed)
Pt sleeping and does not want anyone contacted at this time
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
## 2019-07-29 ENCOUNTER — Other Ambulatory Visit: Payer: Self-pay

## 2019-07-29 ENCOUNTER — Encounter (HOSPITAL_COMMUNITY): Payer: Self-pay | Admitting: Emergency Medicine

## 2019-07-29 ENCOUNTER — Ambulatory Visit (HOSPITAL_COMMUNITY)
Admission: EM | Admit: 2019-07-29 | Discharge: 2019-07-29 | Disposition: A | Discharge status: Home / Self Care | Payer: Self-pay | Attending: Family Medicine | Admitting: Family Medicine

## 2019-07-29 DIAGNOSIS — Z87891 Personal history of nicotine dependence: Secondary | ICD-10-CM | POA: Insufficient documentation

## 2019-07-29 DIAGNOSIS — R6883 Chills (without fever): Secondary | ICD-10-CM | POA: Insufficient documentation

## 2019-07-29 DIAGNOSIS — Z79899 Other long term (current) drug therapy: Secondary | ICD-10-CM | POA: Insufficient documentation

## 2019-07-29 DIAGNOSIS — Z20828 Contact with and (suspected) exposure to other viral communicable diseases: Secondary | ICD-10-CM | POA: Insufficient documentation

## 2019-07-29 DIAGNOSIS — R52 Pain, unspecified: Secondary | ICD-10-CM | POA: Insufficient documentation

## 2019-07-29 NOTE — ED Triage Notes (Signed)
Pt here for fever this afternoon with chills

## 2019-07-29 NOTE — Discharge Instructions (Signed)
Stay home until results return Rest, drink plenty of fluids, Tylenol and ibuprofen for fever, body aches If developing congestion may try over-the-counter Mucinex, Flonase, Zyrtec or Sudafed if If developing cough may try over-the-counter Delsym, Robitussin-DM Please follow-up if developing worsening symptoms, shortness of breath      Person Under Monitoring Name: Shane Gilbert  Location[place] [address] [place] [id number]   Infection Prevention Recommendations for Individuals Confirmed to have, or Being Evaluated for, 2019 Novel Coronavirus (COVID-19) Infection Who Receive Care at Home  Individuals who are confirmed to have, or are being evaluated for, COVID-19 should follow the prevention steps below until a healthcare provider or local or state health department says they can return to normal activities.  Stay home except to get medical care You should restrict activities outside your home, except for getting medical care. Do not go to work, school, or public areas, and do not use public transportation or taxis.  Call ahead before visiting your doctor Before your medical appointment, call the healthcare provider and tell them that you have, or are being evaluated for, COVID-19 infection. This will help the healthcare providers office take steps to keep other people from getting infected. Ask your healthcare provider to call the local or state health department.  Monitor your symptoms Seek prompt medical attention if your illness is worsening (e.g., difficulty breathing). Before going to your medical appointment, call the healthcare provider and tell them that you have, or are being evaluated for, COVID-19 infection. Ask your healthcare provider to call the local or state health department.  Wear a facemask You should wear a facemask that covers your nose and mouth when you are in the same room with other people and when you visit a healthcare provider. People who  live with or visit you should also wear a facemask while they are in the same room with you.  Separate yourself from other people in your home As much as possible, you should stay in a different room from other people in your home. Also, you should use a separate bathroom, if available.  Avoid sharing household items You should not share dishes, drinking glasses, cups, eating utensils, towels, bedding, or other items with other people in your home. After using these items, you should wash them thoroughly with soap and water.  Cover your coughs and sneezes Cover your mouth and nose with a tissue when you cough or sneeze, or you can cough or sneeze into your sleeve. Throw used tissues in a lined trash can, and immediately wash your hands with soap and water for at least 20 seconds or use an alcohol-based hand rub.  Wash your [employer]hands Wash your hands often and thoroughly with soap and water for at least 20 seconds. You can use an alcohol-based hand sanitizer if soap and water are not available and if your hands are not visibly dirty. Avoid touching your eyes, nose, and mouth with unwashed hands.   Prevention Steps for Caregivers and Household Members of Individuals Confirmed to have, or Being Evaluated for, COVID-19 Infection Being Cared for in the Home  If you live with, or provide care at home for, a person confirmed to have, or being evaluated for, COVID-19 infection please follow these guidelines to prevent infection:  Follow healthcare providers instructions Make sure that you understand and can help the patient follow any healthcare provider instructions for all care.  Provide for the patients basic needs You should help the patient with basic needs in the  home and provide support for getting groceries, prescriptions, and other personal needs.  Monitor the patients symptoms If they are getting sicker, call his or her medical provider and tell them that the patient has, or is  being evaluated for, COVID-19 infection. This will help the healthcare providers office take steps to keep other people from getting infected. Ask the healthcare provider to call the local or state health department.  Limit the number of people who have contact with the patient If possible, have only one caregiver for the patient. Other household members should stay in another home or place of residence. If this is not possible, they should stay in another room, or be separated from the patient as much as possible. Use a separate bathroom, if available. Restrict visitors who do not have an essential need to be in the home.  Keep older adults, very young children, and other sick people away from the patient Keep older adults, very young children, and those who have compromised immune systems or chronic health conditions away from the patient. This includes people with chronic heart, lung, or kidney conditions, diabetes, and cancer.  Ensure good ventilation Make sure that shared spaces in the home have good air flow, such as from an air conditioner or an opened window, weather permitting.  Wash your hands often Wash your hands often and thoroughly with soap and water for at least 20 seconds. You can use an alcohol based hand sanitizer if soap and water are not available and if your hands are not visibly dirty. Avoid touching your eyes, nose, and mouth with unwashed hands. Use disposable paper towels to dry your hands. If not available, use dedicated cloth towels and replace them when they become wet.  Wear a facemask and gloves Wear a disposable facemask at all times in the room and gloves when you touch or have contact with the patients blood, body fluids, and/or secretions or excretions, such as sweat, saliva, sputum, nasal mucus, vomit, urine, or feces.  Ensure the mask fits over your nose and mouth tightly, and do not touch it during use. Throw out disposable facemasks and gloves after  using them. Do not reuse. Wash your hands immediately after removing your facemask and gloves. If your personal clothing becomes contaminated, carefully remove clothing and launder. Wash your hands after handling contaminated clothing. Place all used disposable facemasks, gloves, and other waste in a lined container before disposing them with other household waste. Remove gloves and wash your hands immediately after handling these items.  Do not share dishes, glasses, or other household items with the patient Avoid sharing household items. You should not share dishes, drinking glasses, cups, eating utensils, towels, bedding, or other items with a patient who is confirmed to have, or being evaluated for, COVID-19 infection. After the person uses these items, you should wash them thoroughly with soap and water.  Wash laundry thoroughly Immediately remove and wash clothes or bedding that have blood, body fluids, and/or secretions or excretions, such as sweat, saliva, sputum, nasal mucus, vomit, urine, or feces, on them. Wear gloves when handling laundry from the patient. Read and follow directions on labels of laundry or clothing items and detergent. In general, wash and dry with the warmest temperatures recommended on the label.  Clean all areas the individual has used often Clean all touchable surfaces, such as counters, tabletops, doorknobs, bathroom fixtures, toilets, phones, keyboards, tablets, and bedside tables, every day. Also, clean any surfaces that may have blood, body fluids, and/or  secretions or excretions on them. Wear gloves when cleaning surfaces the patient has come in contact with. Use a diluted bleach solution (e.g., dilute bleach with 1 part bleach and 10 parts water) or a household disinfectant with a label that says EPA-registered for coronaviruses. To make a bleach solution at home, add 1 tablespoon of bleach to 1 quart (4 cups) of water. For a larger supply, add  cup of bleach  to 1 gallon (16 cups) of water. Read labels of cleaning products and follow recommendations provided on product labels. Labels contain instructions for safe and effective use of the cleaning product including precautions you should take when applying the product, such as wearing gloves or eye protection and making sure you have good ventilation during use of the product. Remove gloves and wash hands immediately after cleaning.  Monitor yourself for signs and symptoms of illness Caregivers and household members are considered close contacts, should monitor their health, and will be asked to limit movement outside of the home to the extent possible. Follow the monitoring steps for close contacts listed on the symptom monitoring form.   ? If you have additional questions, contact your local health department or call the epidemiologist on call at [phone] (available 24/7). ? This guidance is subject to change. For the most up-to-date guidance from [facility], please refer to their website: [email or web address]

## 2019-07-29 NOTE — ED Provider Notes (Signed)
[facility]    CSN: [id number] Arrival date & time: 07/29/19  1621      History   Chief Complaint Chief Complaint  Patient presents with  . Fever    HPI Shane Gilbert is a 28 y.o. male history of tobacco use, alcohol use, cocaine use, presenting today for evaluation of fever and chills.  Patient states that earlier this morning he felt normal, but throughout the day he has developed hot and cold chills, body aches and had measured fever of 102 at home.  He has taken Tylenol and ibuprofen since.  He denies any URI symptoms of congestion cough or sore throat.  Denies any nausea or vomiting.  Had appetite earlier, but after he ate 1 bite his appetite diminished.  Denies known exposure to COVID.  Concerned as he has high risk family members at home, lives with his grandparents.  HPI  History reviewed. No pertinent past medical history.  Patient Active Problem List   Diagnosis Date Noted  . History of crack cocaine use 11/05/2018  . Cocaine use disorder, severe, dependence (HCC) 11/05/2018  . Family history of drug addiction 11/05/2018  . Family hx of alcoholism 11/05/2018  . Current smoker 11/05/2018  . Alcohol use disorder, severe, dependence (HCC) 11/01/2018    Past Surgical History:  Procedure Laterality Date  . ESOPHAGOGASTRODUODENOSCOPY  11/15/2011   Procedure: ESOPHAGOGASTRODUODENOSCOPY (EGD);  Surgeon: Rob Buntinganiel Jacobs, MD;  [facility];  Service: Gastroenterology;  Laterality: N/A;       Home Medications    Prior to Admission medications   Medication Sig Start Date End Date Taking? Authorizing Provider  baclofen (LIORESAL) 10 MG tablet Take 1 tablet (10 mg total) by mouth 3 (three) times daily. Patient not taking: Reported on 07/29/2019 11/05/18 11/05/19  Court JoyKober, Charles E, PA-C  cephALEXin [facility](KEFLEX) 500 MG capsule 2 caps po bid x 7 days Patient not taking: Reported on 07/29/2019 05/11/19   Arthor CaptainHarris, Abigail, PA-C  dicyclomine (BENTYL) 20 MG tablet Take 1  tablet (20 mg total) by mouth 2 (two) times daily. Patient not taking: Reported on 07/29/2019 12/07/15   Elpidio AnisUpstill, Shari, PA-C  meloxicam (MOBIC) 15 MG tablet Take 1 tablet (15 mg total) by mouth daily. 05/11/19   Harris, Cammy CopaAbigail, PA-C  polyethylene glycol [facility](MIRALAX) packet Take 17 g by mouth 3 (three) times daily. For 2-3 days or until you have good bowel evacuation 12/07/15   Elpidio AnisUpstill, Shari, PA-C    Family History History reviewed. No pertinent family history.  Social History Social History   Tobacco Use  . Smoking status: Former Games developermoker  . Smokeless tobacco: Never Used  Substance Use Topics  . Alcohol use: Yes    Comment: social  . Drug use: Yes    Types: Marijuana, Cocaine     Allergies   Patient has no known allergies.   Review of Systems Review of Systems  Constitutional: Positive for appetite change, chills, fatigue and fever. Negative for activity change.  HENT: Negative for congestion, ear pain, rhinorrhea, sinus pressure, sore throat and trouble swallowing.   Eyes: Negative for discharge and redness.  Respiratory: Negative for cough, chest tightness and shortness of breath.   Cardiovascular: Negative for chest pain.  Gastrointestinal: Negative for abdominal pain, diarrhea, nausea and vomiting.  Musculoskeletal: Negative for myalgias.  Skin: Negative for rash.  Neurological: Negative for dizziness, light-headedness and headaches.     Physical Exam Triage Vital Signs ED Triage Vitals  Enc Vitals Group     BP 07/29/19  1641 136/80     Pulse Rate 07/29/19 1641 77     Resp 07/29/19 1641 16     Temp 07/29/19 1641 98 F (36.7 C)     Temp Source 07/29/19 1641 Temporal     SpO2 07/29/19 1641 99 %     Weight --      Height --      Head Circumference --      Peak Flow --      Pain Score 07/29/19 1644 0     Pain Loc --      Pain Edu? --      Excl. in GC? --    No data found.  Updated Vital Signs BP 136/80 (BP Location: Left Arm)   Pulse 77   Temp 98 F (36.7  C) (Temporal)   Resp 16   SpO2 99%   Visual Acuity Right Eye Distance:   Left Eye Distance:   Bilateral Distance:    Right Eye Near:   Left Eye Near:    Bilateral Near:     Physical Exam Vitals signs and nursing note reviewed.  Constitutional:      Appearance: He is well-developed.  HENT:     Head: Normocephalic and atraumatic.     Mouth/Throat:     Comments: Oral mucosa pink and moist, no tonsillar enlargement or exudate. Posterior pharynx patent and nonerythematous, no uvula deviation or swelling. Normal phonation. Eyes:     Conjunctiva/sclera: Conjunctivae normal.  Neck:     Musculoskeletal: Neck supple.  Cardiovascular:     Rate and Rhythm: Normal rate and regular rhythm.     Heart sounds: No murmur.  Pulmonary:     Effort: Pulmonary effort is normal. No respiratory distress.     Breath sounds: Normal breath sounds.     Comments: Breathing comfortably at rest, CTABL, no wheezing, rales or other adventitious sounds auscultated Abdominal:     Palpations: Abdomen is soft.     Tenderness: There is no abdominal tenderness.  Skin:    General: Skin is warm and dry.  Neurological:     Mental Status: He is alert.      UC Treatments / Results  Labs (all labs ordered are listed, but only abnormal results are displayed) Labs Reviewed  NOVEL CORONAVIRUS, NAA (HOSPITAL ORDER, SEND-OUT TO REF LAB)    EKG   Radiology No results found.  Procedures Procedures (including critical care time)  Medications Ordered in UC Medications - No data to display  Initial Impression / Assessment and Plan / UC Course  I have reviewed the triage vital signs and the nursing notes.  Pertinent labs & imaging results that were available during my care of the patient were reviewed by me and considered in my medical decision making (see chart for details).     Vital signs stable today, 1 day of chills and body aches.  Reported fever at home.  COVID swab obtained.  Likely viral  etiology.  Will recommend symptomatic and supportive care at this time, nonspecific symptoms.  Recommending self quarantine until swab results return.Discussed strict return precautions. Patient verbalized understanding and is agreeable with plan.  Final Clinical Impressions(s) / UC Diagnoses   Final diagnoses:  Chills  Body aches     Discharge Instructions      Stay home until results return Rest, drink plenty of fluids, Tylenol and ibuprofen for fever, body aches If developing congestion may try over-the-counter Mucinex, Flonase, Zyrtec or Sudafed if If developing cough may  try over-the-counter Delsym, Robitussin-DM Please follow-up if developing worsening symptoms, shortness of breath      Person Under Monitoring Name: Shane Gilbert  Location: [address] [place] [address]   Infection Prevention Recommendations for Individuals Confirmed to have, or Being Evaluated for, 2019 Novel Coronavirus (COVID-19) Infection Who Receive Care at Home  Individuals who are confirmed to have, or are being evaluated for, COVID-19 should follow the prevention steps below until a healthcare provider or local or state health department says they can return to normal activities.  Stay home except to get medical care You should restrict activities outside your home, except for getting medical care. Do not go to work, school, or public areas, and do not use public transportation or taxis.  Call ahead before visiting your doctor Before your medical appointment, call the healthcare provider and tell them that you have, or are being evaluated for, COVID-19 infection. This will help the healthcare provider's office take steps to keep other people from getting infected. Ask your healthcare provider to call the local or state health department.  Monitor your symptoms Seek prompt medical attention if your illness is worsening (e.g., difficulty breathing). Before going to your medical  appointment, call the healthcare provider and tell them that you have, or are being evaluated for, COVID-19 infection. Ask your healthcare provider to call the local or state health department.  Wear a facemask You should wear a facemask that covers your nose and mouth when you are in the same room with other people and when you visit a healthcare provider. People who live with or visit you should also wear a facemask while they are in the same room with you.  Separate yourself from other people in your home As much as possible, you should stay in a different room from other people in your home. Also, you should use a separate bathroom, if available.  Avoid sharing household items You should not share dishes, drinking glasses, cups, eating utensils, towels, bedding, or other items with other people in your home. After using these items, you should wash them thoroughly with soap and water.  Cover your coughs and sneezes Cover your mouth and nose with a tissue when you cough or sneeze, or you can cough or sneeze into your sleeve. Throw used tissues in a lined trash can, and immediately wash your hands with soap and water for at least 20 seconds or use an alcohol-based hand rub.  Wash your [employer] your hands often and thoroughly with soap and water for at least 20 seconds. You can use an alcohol-based hand sanitizer if soap and water are not available and if your hands are not visibly dirty. Avoid touching your eyes, nose, and mouth with unwashed hands.   Prevention Steps for Caregivers and Household Members of Individuals Confirmed to have, or Being Evaluated for, COVID-19 Infection Being Cared for in the Home  If you live with, or provide care at home for, a person confirmed to have, or being evaluated for, COVID-19 infection please follow these guidelines to prevent infection:  Follow healthcare provider's instructions Make sure that you understand and can help the patient  follow any healthcare provider instructions for all care.  Provide for the patient's basic needs You should help the patient with basic needs in the home and provide support for getting groceries, prescriptions, and other personal needs.  Monitor the patient's symptoms If they are getting sicker, call his or her medical provider and tell them that  the patient has, or is being evaluated for, COVID-19 infection. This will help the healthcare provider's office take steps to keep other people from getting infected. Ask the healthcare provider to call the local or state health department.  Limit the number of people who have contact with the patient  If possible, have only one caregiver for the patient.  Other household members should stay in another home or place of residence. If this is not possible, they should stay  in another room, or be separated from the patient as much as possible. Use a separate bathroom, if available.  Restrict visitors who do not have an essential need to be in the home.  Keep older adults, very young children, and other sick people away from the patient Keep older adults, very young children, and those who have compromised immune systems or chronic health conditions away from the patient. This includes people with chronic heart, lung, or kidney conditions, diabetes, and cancer.  Ensure good ventilation Make sure that shared spaces in the home have good air flow, such as from an air conditioner or an opened window, weather permitting.  Wash your hands often  Wash your hands often and thoroughly with soap and water for at least 20 seconds. You can use an alcohol based hand sanitizer if soap and water are not available and if your hands are not visibly dirty.  Avoid touching your eyes, nose, and mouth with unwashed hands.  Use disposable paper towels to dry your hands. If not available, use dedicated cloth towels and replace them when they become wet.  Wear a  facemask and gloves  Wear a disposable facemask at all times in the room and gloves when you touch or have contact with the patient's blood, body fluids, and/or secretions or excretions, such as sweat, saliva, sputum, nasal mucus, vomit, urine, or feces.  Ensure the mask fits over your nose and mouth tightly, and do not touch it during use.  Throw out disposable facemasks and gloves after using them. Do not reuse.  Wash your hands immediately after removing your facemask and gloves.  If your personal clothing becomes contaminated, carefully remove clothing and launder. Wash your hands after handling contaminated clothing.  Place all used disposable facemasks, gloves, and other waste in a lined container before disposing them with other household waste.  Remove gloves and wash your hands immediately after handling these items.  Do not share dishes, glasses, or other household items with the patient  Avoid sharing household items. You should not share dishes, drinking glasses, cups, eating utensils, towels, bedding, or other items with a patient who is confirmed to have, or being evaluated for, COVID-19 infection.  After the person uses these items, you should wash them thoroughly with soap and water.  Wash laundry thoroughly  Immediately remove and wash clothes or bedding that have blood, body fluids, and/or secretions or excretions, such as sweat, saliva, sputum, nasal mucus, vomit, urine, or feces, on them.  Wear gloves when handling laundry from the patient.  Read and follow directions on labels of laundry or clothing items and detergent. In general, wash and dry with the warmest temperatures recommended on the label.  Clean all areas the individual has used often  Clean all touchable surfaces, such as counters, tabletops, doorknobs, bathroom fixtures, toilets, phones, keyboards, tablets, and bedside tables, every day. Also, clean any surfaces that may have blood, body fluids, and/or  secretions or excretions on them.  Wear gloves when cleaning surfaces the patient  has come in contact with.  Use a diluted bleach solution (e.g., dilute bleach with 1 part bleach and 10 parts water) or a household disinfectant with a label that says EPA-registered for coronaviruses. To make a bleach solution at home, add 1 tablespoon of bleach to 1 quart (4 cups) of water. For a larger supply, add  cup of bleach to 1 gallon (16 cups) of water.  Read labels of cleaning products and follow recommendations provided on product labels. Labels contain instructions for safe and effective use of the cleaning product including precautions you should take when applying the product, such as wearing gloves or eye protection and making sure you have good ventilation during use of the product.  Remove gloves and wash hands immediately after cleaning.  Monitor yourself for signs and symptoms of illness Caregivers and household members are considered close contacts, should monitor their health, and will be asked to limit movement outside of the home to the extent possible. Follow the monitoring steps for close contacts listed on the symptom monitoring form.   ? If you have additional questions, contact your local health department or call the epidemiologist on call at [phone] (available 24/7). ? This guidance is subject to change. For the most up-to-date guidance from [facility], please refer to their website: YouBlogs.pl    ED Prescriptions    None     Controlled Substance Prescriptions [place] Controlled Substance Registry consulted? Not Applicable   Janith Lima, [place] 07/29/19 1721

## 2019-07-31 LAB — NOVEL CORONAVIRUS, NAA (HOSP ORDER, SEND-OUT TO REF LAB; TAT 18-24 HRS): SARS-CoV-2, NAA: NOT DETECTED

## 2019-08-01 ENCOUNTER — Encounter (HOSPITAL_COMMUNITY): Payer: Self-pay

## 2019-08-06 ENCOUNTER — Ambulatory Visit (HOSPITAL_COMMUNITY)
Admission: EM | Admit: 2019-08-06 | Discharge: 2019-08-06 | Disposition: A | Discharge status: Home / Self Care | Payer: Self-pay | Attending: Urgent Care | Admitting: Urgent Care

## 2019-08-06 ENCOUNTER — Other Ambulatory Visit: Payer: Self-pay

## 2019-08-06 ENCOUNTER — Encounter (HOSPITAL_COMMUNITY): Payer: Self-pay | Admitting: Urgent Care

## 2019-08-06 DIAGNOSIS — K529 Noninfective gastroenteritis and colitis, unspecified: Secondary | ICD-10-CM

## 2019-08-06 DIAGNOSIS — R195 Other fecal abnormalities: Secondary | ICD-10-CM

## 2019-08-06 DIAGNOSIS — R1111 Vomiting without nausea: Secondary | ICD-10-CM

## 2019-08-06 MED ORDER — ONDANSETRON 8 MG PO TBDP: 8.0000 mg | ORAL_TABLET | Freq: Three times a day (TID) | ORAL | 0 refills | Status: AC | PRN

## 2019-08-06 NOTE — ED Triage Notes (Signed)
Pt seen here last week and tested negative for covid; pt sts still some diarrhea and vomiting x 1 yesterday

## 2019-08-06 NOTE — ED Provider Notes (Addendum)
MRN: [id number] DOB: August 01, 1991  Subjective:   Shane Gilbert is a 28 y.o. male presenting for 5 days of loose darker stools. Reports ongoing GI discomfort, initially had 5-10 bowel movements daily and has slowed down.  He did take Imodium which helped but only needed 2 doses.  Has had pain with his bowel movements in mid abdomen. Feels like his stool moving through his colon. Has been trying to hydrate, trying to eat light meals and less meat. His appetite is the same but is apprehensive about eating foods. Works at [employer] and was advised to seek medical attention since he went back to work for the first time yesterday. Had negative COVID 19 testing 07/29/2019. Has had an EGD, this was to look for a tooth pick in his throat, was normal.  No recent antibiotic use.  No history of belly surgeries.  He is not taking any medications currently.    No Known Allergies  Denies past medical history.  Past Surgical History:  Procedure Laterality Date  . ESOPHAGOGASTRODUODENOSCOPY  11/15/2011   Procedure: ESOPHAGOGASTRODUODENOSCOPY (EGD);  Surgeon: Rob Bunting, MD;  Location: [facility] OR;  Service: Gastroenterology;  Laterality: N/A;    Review of Systems  Constitutional: Negative for fever and malaise/fatigue.  HENT: Negative for congestion, ear pain, sinus pain and sore throat.   Eyes: Negative for blurred vision, double vision, discharge and redness.  Respiratory: Negative for cough, hemoptysis, shortness of breath and wheezing.   Cardiovascular: Negative for chest pain.  Gastrointestinal: Positive for abdominal pain, diarrhea and vomiting (1 episode last night). Negative for blood in stool, constipation and nausea.  Genitourinary: Negative for dysuria, flank pain and hematuria.  Musculoskeletal: Negative for myalgias.  Skin: Negative for rash.  Neurological: Negative for dizziness, weakness and headaches.  Psychiatric/Behavioral: Negative for depression and substance abuse.    Objective:    Vitals: BP (!) 153/93 (BP Location: Right Arm)   Pulse 82   Temp 98.2 F (36.8 C) (Oral)   Resp 18   SpO2 99%   Physical Exam Constitutional:      General: He is not in acute distress.    Appearance: Normal appearance. He is well-developed. He is not ill-appearing, toxic-appearing or diaphoretic.  HENT:     Head: Normocephalic and atraumatic.     Right Ear: External ear normal.     Left Ear: External ear normal.     Nose: Nose normal.     Mouth/Throat:     Mouth: Mucous membranes are moist.     Pharynx: Oropharynx is clear.  Eyes:     General: No scleral icterus.    Extraocular Movements: Extraocular movements intact.     Pupils: Pupils are equal, round, and reactive to light.  Cardiovascular:     Rate and Rhythm: Normal rate and regular rhythm.     Heart sounds: Normal heart sounds. No murmur. No friction rub. No gallop.   Pulmonary:     Effort: Pulmonary effort is normal. No respiratory distress.     Breath sounds: Normal breath sounds. No stridor. No wheezing, rhonchi or rales.  Abdominal:     General: Bowel sounds are normal. There is no distension.     Palpations: Abdomen is soft. There is no mass.     Tenderness: There is abdominal tenderness (Generalized over mid to lower abdomen). There is no guarding or rebound.  Skin:    General: Skin is warm and dry.  Neurological:     Mental Status: He is alert and  oriented to person, place, and time.  Psychiatric:        Mood and Affect: Mood normal.        Behavior: Behavior normal.        Thought Content: Thought content normal.      Assessment and Plan :   1. Gastroenteritis   2. Loose stools   3. Vomiting without nausea, intractability of vomiting not specified, unspecified vomiting type     We will manage for gastroenteritis with supportive care.  Recommended patient hydrate well, eat light meals and maintain electrolytes.  Will use Zofran for nausea, vomiting.  We both agreed to hold off on COVID-19 testing  as he was already shown to be negative for this.  Counseled patient on potential for adverse effects with medications prescribed/recommended today, ER and return-to-clinic precautions discussed, patient verbalized understanding.   Wallis Bamberg, PA-C 08/06/19 1127

## 2019-11-20 IMAGING — CR DG CHEST 2V
2 series · 2 of 2 positions shown · non-contrast
Comparison: April 20, 2005

CLINICAL DATA: Altered mental status with questionable drug
overdose

EXAM:
CHEST - 2 VIEW

[w chest pa]
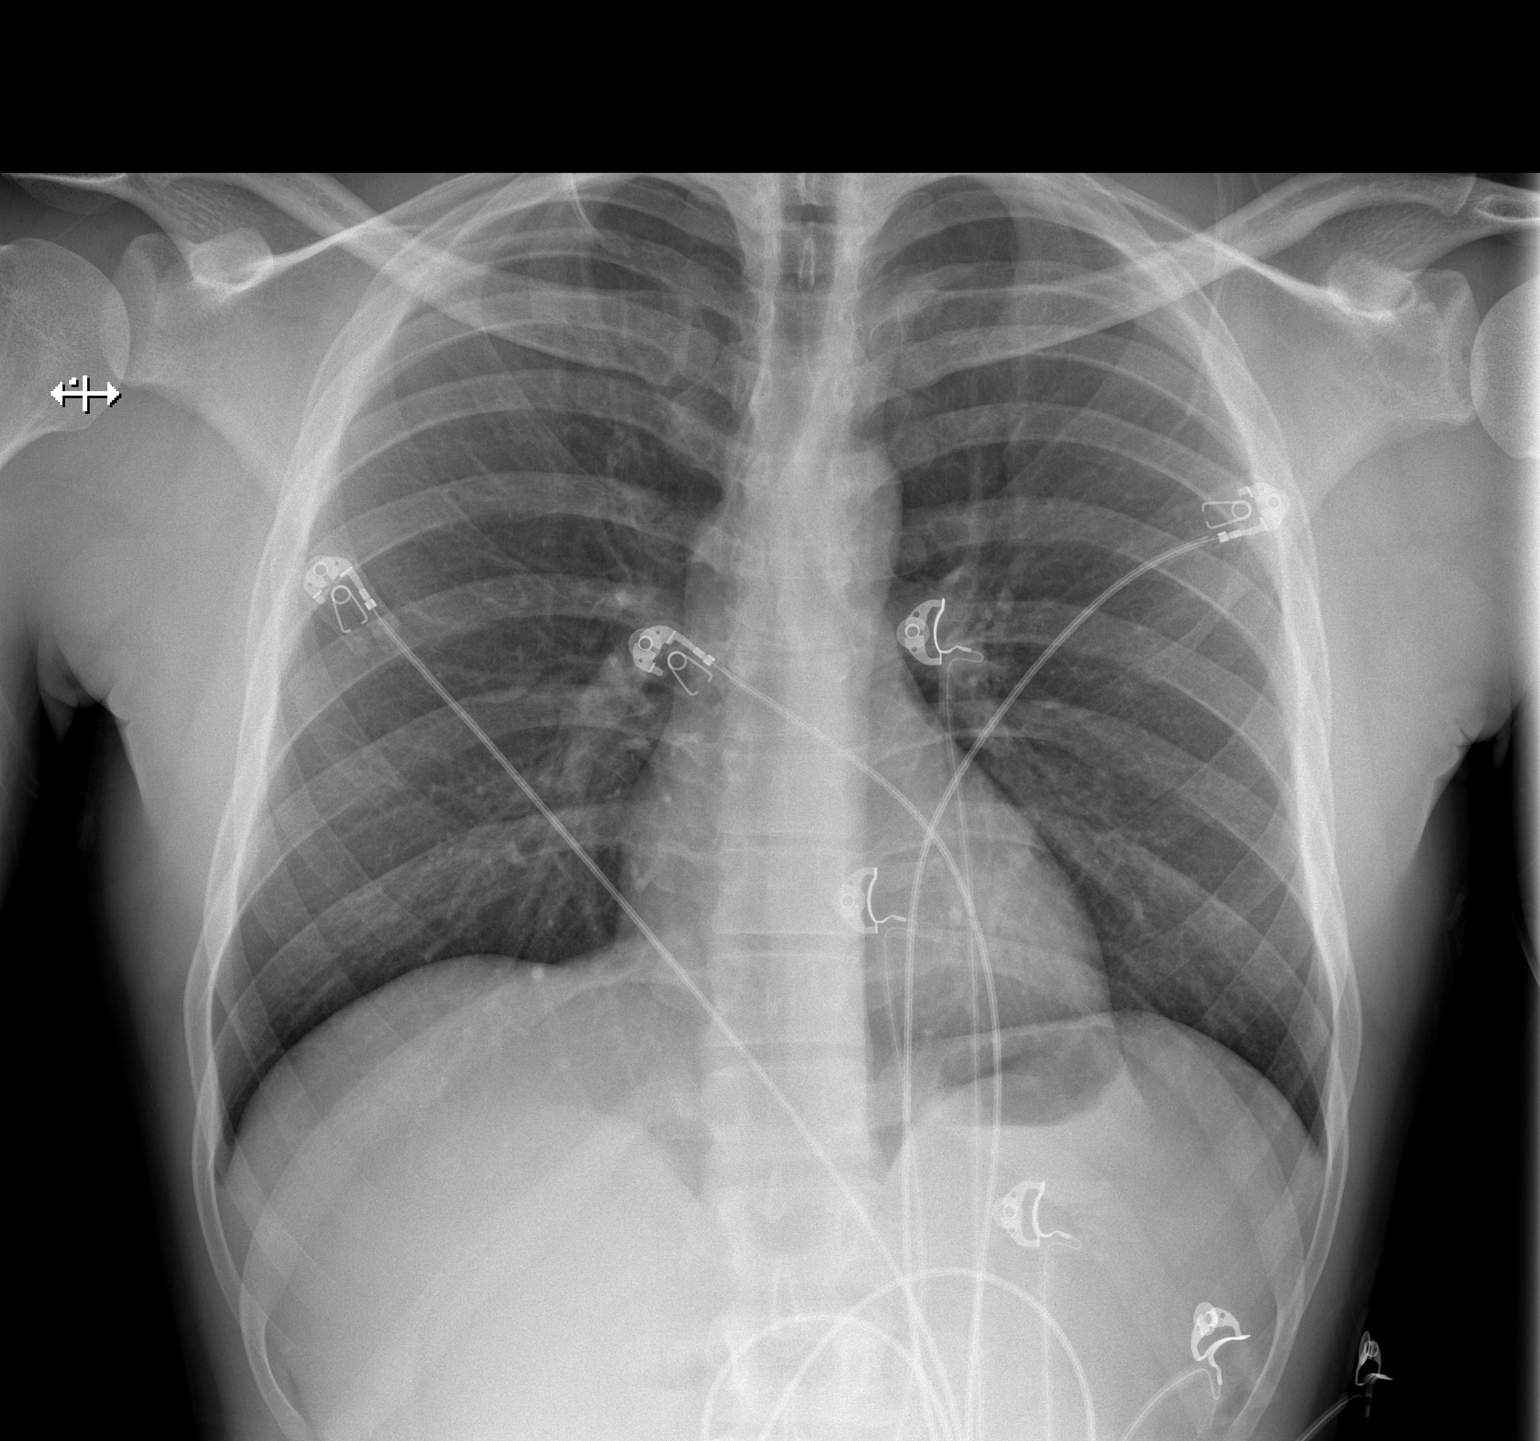

[w chest lat]
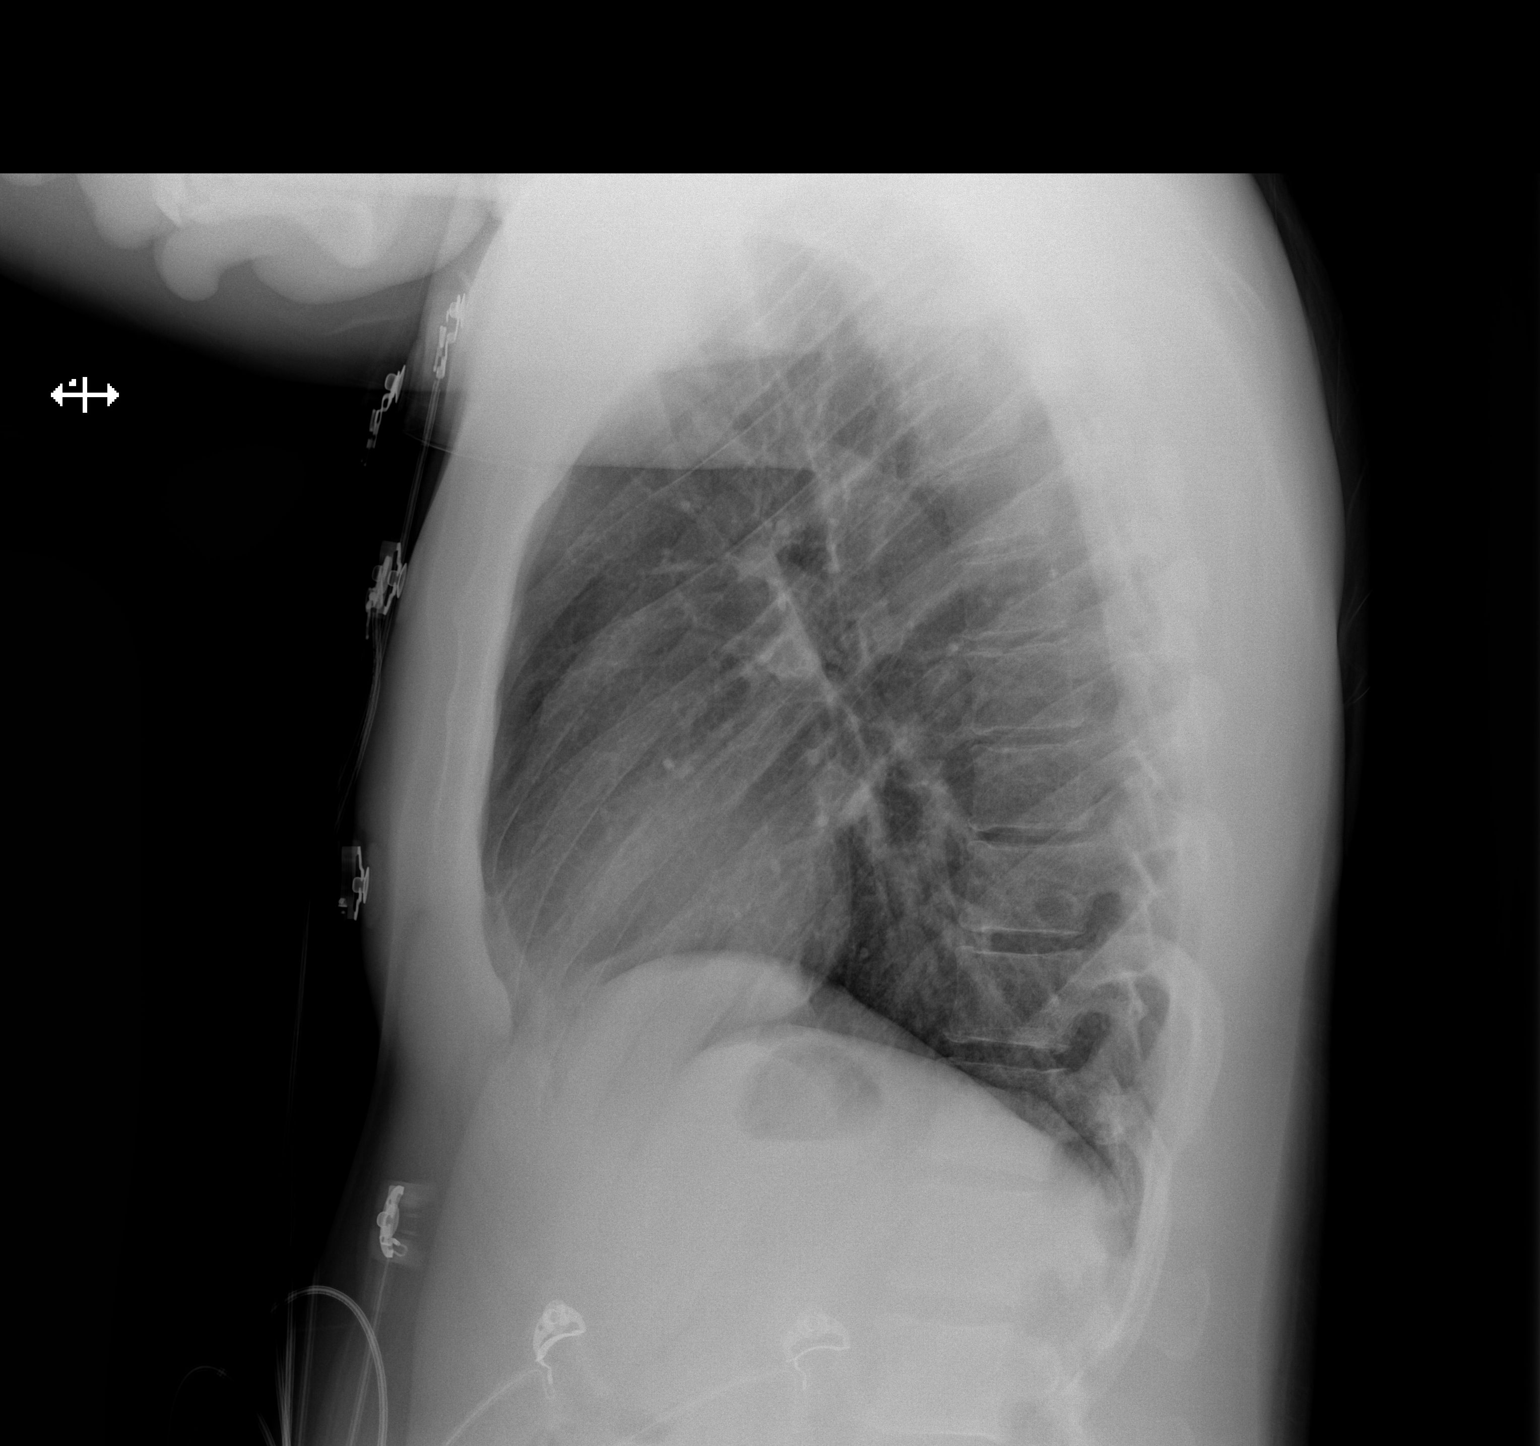

[2 of 2 positions shown; findings below may reference images not displayed]

FINDINGS: Lungs are clear. Heart size and pulmonary vascularity are normal. No
adenopathy. No bone lesions. No pneumothorax or pneumomediastinum.
IMPRESSION: No edema or consolidation.

## 2020-04-23 ENCOUNTER — Ambulatory Visit: Payer: Self-pay | Admitting: Orthopedic Surgery

## 2020-04-28 ENCOUNTER — Ambulatory Visit (INDEPENDENT_AMBULATORY_CARE_PROVIDER_SITE_OTHER): Payer: Self-pay

## 2020-04-28 ENCOUNTER — Encounter: Payer: Self-pay | Admitting: Orthopedic Surgery

## 2020-04-28 ENCOUNTER — Other Ambulatory Visit: Payer: Self-pay

## 2020-04-28 ENCOUNTER — Ambulatory Visit (INDEPENDENT_AMBULATORY_CARE_PROVIDER_SITE_OTHER): Payer: Self-pay | Admitting: Physician Assistant

## 2020-04-28 VITALS — Ht 68.0 in | Wt 165.0 lb

## 2020-04-28 DIAGNOSIS — M25312 Other instability, left shoulder: Secondary | ICD-10-CM

## 2020-04-28 NOTE — Progress Notes (Signed)
Office Visit Note   Patient: Shane Gilbert           Date of Birth: 18-Feb-1991           MRN: [id number] Visit Date: 04/28/2020              Requested by: No referring provider defined for this encounter. PCP: Patient, No Pcp Per  Chief Complaint  Patient presents with  . Left Shoulder - Follow-up    Old subluxation injury >10 years ago military requires clearance.       HPI: This is a pleasant 29 year old gentleman who presents today for military clearance for his left shoulder.  He has a remote history of subluxing his shoulder when he was 29 years old while weight training.  He states he did not have any pain he just felt the shoulder slightly sublux and spontaneously reduced back into the glenoid fossa.  This was not a dislocation.  He did not have any pain.  He has not had any difficulties with his shoulder since he has had no treatment no physical therapy.  He was seen by a provider at the time of the incident who did not recommend any follow-up treatment.  He is right-handed. Assessment & Plan: Visit Diagnoses:  1. Instability of left shoulder joint     Plan: Remote history of left shoulder subluxation without any sequela 12 years ago  The patient has not required any treatment for this at the time of the injury other than an evaluation.  He has  not needed any physical therapy any bracing,any surgries and he has been very active.  He does not have any feelings of instability.  He does not have any limitations.  He does not need any treatment.  His prognosis is excellent and I cannot appreciate any difference from his left shoulder to right shoulder with regards to strength range of motion or findings consistent with instability.  I would clear him to participate in any of his training without hesitation or limitations  Follow-Up Instructions: No follow-ups on file.   Ortho Exam  Patient is alert, oriented, no adenopathy, well-dressed, normal affect, normal respiratory  effort. Left shoulder: No muscle atrophy.  Distal CMS is intact.  He has equivalent internal rotation to the right shoulder.  He has equivalent external rotation to the right shoulder he has equivalent extension to the right shoulder past 180 degrees.  He has 5 out of 5 strength with abductors abductors internal and external rotation.  He has a negative apprehension test negative impingement findings.  With manipulation humeral head stays seated well in the glenoid and equivalent to the right arm.  He has a negative sulcus sign  Imaging:  X-rays of his left shoulder were reviewed today.  There is no osseous or soft tissue abnormalities humeral head remains well reduced in the glenoid.  Labs: No results found for: HGBA1C, ESRSEDRATE, CRP, LABURIC, REPTSTATUS, GRAMSTAIN, CULT, LABORGA   Lab Results  Component Value Date   ALBUMIN 3.9 12/07/2015    No results found for: MG No results found for: VD25OH  No results found for: PREALBUMIN CBC EXTENDED Latest Ref Rng & Units 12/05/2018 12/07/2015 11/15/2011  WBC 4.0 - 10.5 K/uL 6.4 9.5 5.1  RBC 4.22 - 5.81 MIL/uL 6.01(H) 5.37 5.38  HGB 13.0 - 17.0 g/dL 40.9 81.1 91.4  HCT 78.2 - 52.0 % 48.9 44.2 43.0  PLT 150 - 400 K/uL 286 197 201     Body mass index  is 25.09 kg/m.  Orders:  Orders Placed This Encounter  Procedures  . XR Shoulder Left   No orders of the defined types were placed in this encounter.    Procedures: No procedures performed  Clinical Data: No additional findings.  ROS:  All other systems negative, except as noted in the HPI. Review of Systems  Objective: Vital Signs: Ht 5\' 8"  (1.727 m)   Wt 165 lb (74.8 kg)   BMI 25.09 kg/m   Specialty Comments:  No specialty comments available.  PMFS History: Patient Active Problem List   Diagnosis Date Noted  . History of crack cocaine use 11/05/2018  . Cocaine use disorder, severe, dependence ([place]) 11/05/2018  . Family history of drug addiction 11/05/2018  .  Family hx of alcoholism 11/05/2018  . Current smoker 11/05/2018  . Alcohol use disorder, severe, dependence ([place]) 11/01/2018   No past medical history on file.  No family history on file.  Past Surgical History:  Procedure Laterality Date  . ESOPHAGOGASTRODUODENOSCOPY  11/15/2011   Procedure: ESOPHAGOGASTRODUODENOSCOPY (EGD);  Surgeon: Owens Loffler, MD;  Location: [facility];  Service: Gastroenterology;  Laterality: N/A;   Social History   Occupational History  . Not on file  Tobacco Use  . Smoking status: Former Research scientist (life sciences)  . Smokeless tobacco: Never Used  Substance and Sexual Activity  . Alcohol use: Yes    Comment: social  . Drug use: Yes    Types: Marijuana, Cocaine  . Sexual activity: Not on file

## 2020-08-30 ENCOUNTER — Emergency Department (HOSPITAL_BASED_OUTPATIENT_CLINIC_OR_DEPARTMENT_OTHER)
Admission: EM | Admit: 2020-08-30 | Discharge: 2020-08-30 | Disposition: A | Discharge status: Home / Self Care | Payer: Self-pay | Attending: Emergency Medicine | Admitting: Emergency Medicine

## 2020-08-30 ENCOUNTER — Other Ambulatory Visit: Payer: Self-pay

## 2020-08-30 ENCOUNTER — Encounter (HOSPITAL_BASED_OUTPATIENT_CLINIC_OR_DEPARTMENT_OTHER): Payer: Self-pay | Admitting: Emergency Medicine

## 2020-08-30 DIAGNOSIS — Z79899 Other long term (current) drug therapy: Secondary | ICD-10-CM | POA: Insufficient documentation

## 2020-08-30 DIAGNOSIS — F17200 Nicotine dependence, unspecified, uncomplicated: Secondary | ICD-10-CM | POA: Insufficient documentation

## 2020-08-30 DIAGNOSIS — H60331 Swimmer's ear, right ear: Secondary | ICD-10-CM | POA: Insufficient documentation

## 2020-08-30 MED ORDER — CIPROFLOXACIN-HYDROCORTISONE 0.2-1 % OT SUSP
3.0000 [drp] | Freq: Two times a day (BID) | OTIC | Status: DC
  Filled 2020-08-30: qty 10

## 2020-08-30 MED ORDER — ACETAMINOPHEN 325 MG PO TABS
650.0000 mg | ORAL_TABLET | Freq: Once | ORAL | Status: AC
  Administered 2020-08-30: 650 mg via ORAL
  Filled 2020-08-30: qty 2

## 2020-08-30 MED ORDER — CIPROFLOXACIN-DEXAMETHASONE 0.3-0.1 % OT SUSP
4.0000 [drp] | Freq: Once | OTIC | Status: AC
  Administered 2020-08-30: 4 [drp] via OTIC
  Filled 2020-08-30: qty 7.5

## 2020-08-30 NOTE — Discharge Instructions (Addendum)
Use 4 eardrops in your right ear ever 12 hours (twice per day) for 7 days. You were given the bottle of ear drops in the emergency room so you do not need to get them from the pharmacy.  Take tylenol and ibuprofen for pain. Take as directed on the bottle.  Follow up with [facility] and Throat if your symptoms do not improve.  Do not wear ear buds, clean your ears, put anything in your ear.

## 2020-08-30 NOTE — ED Notes (Signed)
Pt discharged to home. Discharge instructions have been discussed with patient and/or family members. Pt verbally acknowledges understanding d/c instructions, and endorses comprehension to checkout at registration before leaving.  °

## 2020-08-30 NOTE — ED Triage Notes (Signed)
Pt here for pain in right ear onset "couple hours ago." Pt denies injury to ear. Reports decrease hearing in right ear.

## 2020-08-30 NOTE — ED Provider Notes (Signed)
[facility] EMERGENCY DEPARTMENT Provider Note   CSN: [id number] Arrival date & time: 08/30/20  0935     History Chief Complaint  Patient presents with  . Otalgia    Shane Gilbert is a 29 y.o. male with past medical history significant for substance and alcohol abuse.  HPI Patient presents to emergency department today with chief complaint of right sided otalgia x 3 hours. He is endorsing decreased hearing in right ear.  He is describing the pain as a throbbing sensation.  States the pain is 7 out of 10 in severity.  Pain does not radiate.  Pain is worse if he coughs, pain not worse with movement. Has typical dry cough in the mornings, nonproductive. No URI symptoms. No medications for symptoms prior to arrival.  He cleans his ears occasionally with a Q-tip but has not in at least 2 weeks.  He did currently switch his ear buds with a friend and has been using his for the last x1 week. Denies any head injury or trauma.  Denies fever, chills, headache, neck pain, shortness of breath, chest pain, rash.    History reviewed. No pertinent past medical history.  Patient Active Problem List   Diagnosis Date Noted  . History of crack cocaine use 11/05/2018  . Cocaine use disorder, severe, dependence ([facility]) 11/05/2018  . Family history of drug addiction 11/05/2018  . Family hx of alcoholism 11/05/2018  . Current smoker 11/05/2018  . Alcohol use disorder, severe, dependence ([facility]) 11/01/2018    Past Surgical History:  Procedure Laterality Date  . ESOPHAGOGASTRODUODENOSCOPY  11/15/2011   Procedure: ESOPHAGOGASTRODUODENOSCOPY (EGD);  Surgeon: Rob Bunting, MD;  Location: [facility] OR;  Service: Gastroenterology;  Laterality: N/A;       No family history on file.  Social History   Tobacco Use  . Smoking status: Current Every Day Smoker  . Smokeless tobacco: Never Used  Vaping Use  . Vaping Use: Never used  Substance Use Topics  . Alcohol use: Yes    Comment: social  . Drug  use: Yes    Types: Marijuana, Cocaine    Home Medications Prior to Admission medications   Medication Sig Start Date End Date Taking? Authorizing Provider  ondansetron (ZOFRAN-ODT) 8 MG disintegrating tablet Take 1 tablet (8 mg total) by mouth every 8 (eight) hours as needed for nausea or vomiting. 08/06/19   Wallis Bamberg, PA-C  dicyclomine (BENTYL) 20 MG tablet Take 1 tablet (20 mg total) by mouth 2 (two) times daily. Patient not taking: Reported on 07/29/2019 12/07/15 08/06/19  Elpidio Anis, PA-C    Allergies    Patient has no known allergies.  Review of Systems   Review of Systems  All other systems are reviewed and are negative for acute change except as noted in the HPI.   Physical Exam Updated Vital Signs BP (!) 130/95 (BP Location: Right Arm)   Pulse 70   Temp 98.2 F (36.8 C) (Oral)   Resp 18   Ht 5' 8.5" (1.74 m)   Wt 80.3 kg   SpO2 100%   BMI 26.52 kg/m   Physical Exam Vitals and nursing note reviewed.  Constitutional:      Appearance: He is well-developed. He is not ill-appearing or toxic-appearing.  HENT:     Head: Normocephalic and atraumatic.     Jaw: There is normal jaw occlusion.     Right Ear: Swelling present. No drainage. No mastoid tenderness. No hemotympanum. Tympanic membrane is erythematous. Tympanic membrane is not  injected, perforated or retracted.     Left Ear: Hearing and external ear normal.     Nose: Nose normal.  Eyes:     General: No scleral icterus.       Right eye: No discharge.        Left eye: No discharge.     Conjunctiva/sclera: Conjunctivae normal.  Neck:     Vascular: No JVD.     Comments: Full ROM intact without spinous process TTP. No bony stepoffs or deformities, no paraspinous muscle TTP or muscle spasms. No rigidity or meningeal signs. No bruising, erythema, or swelling.  Cardiovascular:     Rate and Rhythm: Normal rate and regular rhythm.     Pulses: Normal pulses.     Heart sounds: Normal heart sounds.  Pulmonary:      Effort: Pulmonary effort is normal.     Breath sounds: Normal breath sounds.  Abdominal:     General: There is no distension.  Musculoskeletal:        General: Normal range of motion.     Cervical back: Normal range of motion.  Skin:    General: Skin is warm and dry.  Neurological:     Mental Status: He is oriented to person, place, and time.     GCS: GCS eye subscore is 4. GCS verbal subscore is 5. GCS motor subscore is 6.     Comments: Fluent speech, no facial droop.  Psychiatric:        Behavior: Behavior normal.     ED Results / Procedures / Treatments   Labs (all labs ordered are listed, but only abnormal results are displayed) Labs Reviewed - No data to display  EKG None  Radiology No results found.  Procedures Procedures (including critical care time)  Medications Ordered in ED Medications  ciprofloxacin-dexamethasone (CIPRODEX) 0.3-0.1 % OTIC (EAR) suspension 4 drop (4 drops Right EAR Given 08/30/20 1120)  acetaminophen (TYLENOL) tablet 650 mg (650 mg Oral Given 08/30/20 1120)    ED Course  I have reviewed the triage vital signs and the nursing notes.  Pertinent labs & imaging results that were available during my care of the patient were reviewed by me and considered in my medical decision making (see chart for details).    MDM Rules/Calculators/A&P                          History provided by patient with additional history obtained from chart review.   Pt presenting with otitis externa. No canal occlusion, No FOB seen, Pt afebrile in NAD. Exam non concerning for mastoiditis, cellulitis or malignant OE. Will prescribe ciprodex drops. Advised pcp or ENT follow up in 2-3 days if no improvement with treatment or no complete resolution by 7 days. Patient knows to return earlier if develops rash , allergic reaction to medication, or loss of hearing.   The patient appears reasonably screened and/or stabilized for discharge and I doubt any other medical condition or  other [facility] requiring further screening, evaluation, or treatment in the ED at this time prior to discharge. The patient is safe for discharge with strict return precautions discussed. .   Portions of this note were generated with Scientist, clinical (histocompatibility and immunogenetics). Dictation errors may occur despite best attempts at proofreading.  Final Clinical Impression(s) / ED Diagnoses Final diagnoses:  Acute swimmer's ear of right side    Rx / DC Orders ED Discharge Orders    None  Sherene Sires, PA-C 08/30/20 1142    Alvira Monday, MD 08/31/20 1039

## 2021-03-16 ENCOUNTER — Emergency Department (HOSPITAL_COMMUNITY)
Admission: EM | Admit: 2021-03-16 | Discharge: 2021-03-16 | Disposition: A | Discharge status: Home / Self Care | Payer: Self-pay | Attending: Emergency Medicine | Admitting: Emergency Medicine

## 2021-03-16 ENCOUNTER — Other Ambulatory Visit: Payer: Self-pay

## 2021-03-16 ENCOUNTER — Encounter (HOSPITAL_COMMUNITY): Payer: Self-pay

## 2021-03-16 DIAGNOSIS — R21 Rash and other nonspecific skin eruption: Secondary | ICD-10-CM | POA: Insufficient documentation

## 2021-03-16 DIAGNOSIS — F17210 Nicotine dependence, cigarettes, uncomplicated: Secondary | ICD-10-CM | POA: Insufficient documentation

## 2021-03-16 MED ORDER — PREDNISONE 10 MG (21) PO TBPK: ORAL_TABLET | Freq: Every day | ORAL | 0 refills | Status: DC

## 2021-03-16 NOTE — Discharge Instructions (Signed)
Take the steroids and the taper Dosepak as directed. Make sure you are covering this area to avoid further episodes as this could be caused by something at work. Return to the ER if symptoms worsen, you develop lip or tongue swelling, shortness of breath or chest pain.

## 2021-03-16 NOTE — ED Triage Notes (Signed)
Patient c/o rash on neck x 1 1/2 weeks. Patient states he has ben using A and D ointment and thinks the rash has spread.

## 2021-03-16 NOTE — ED Provider Notes (Signed)
[facility]-EMERGENCY DEPT Provider Note   CSN: [id number] Arrival date & time: 03/16/21  1828     History Chief Complaint  Patient presents with  . Rash    Shane Gilbert is a 30 y.o. male with a past medical history of substance abuse, alcohol abuse presenting to the ED with a chief complaint of rash.  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  He feels that this has spread despite using antibiotic ointment.  He states that he has not had this type of rash in the past.  No known allergies, lip or tongue swelling, shortness of breath, sick contacts with similar symptoms.  Denies any new soaps, lotions or detergents.  No food intake different from usual.  Denies any vomiting  HPI     History reviewed. No pertinent past medical history.  Patient Active Problem List   Diagnosis Date Noted  . History of crack cocaine use 11/05/2018  . Cocaine use disorder, severe, dependence ([facility]) 11/05/2018  . Family history of drug addiction 11/05/2018  . Family hx of alcoholism 11/05/2018  . Current smoker 11/05/2018  . Alcohol use disorder, severe, dependence ([facility]) 11/01/2018    Past Surgical History:  Procedure Laterality Date  . ESOPHAGOGASTRODUODENOSCOPY  11/15/2011   Procedure: ESOPHAGOGASTRODUODENOSCOPY (EGD);  Surgeon: Rob Bunting, MD;  Location: [facility] OR;  Service: Gastroenterology;  Laterality: N/A;  . hand fracture surgery Right        Family History  Problem Relation Age of Onset  . Healthy Mother   . Healthy Father     Social History   Tobacco Use  . Smoking status: Current Every Day Smoker    Packs/day: 0.25    Types: Cigarettes  . Smokeless tobacco: Never Used  Vaping Use  . Vaping Use: Never used  Substance Use Topics  . Alcohol use: Yes    Comment: social  . Drug use: Not Currently    Types: Marijuana, Cocaine    Home  Medications Prior to Admission medications   Medication Sig Start Date End Date Taking? Authorizing Provider  predniSONE (STERAPRED UNI-PAK 21 TAB) 10 MG (21) TBPK tablet Take by mouth daily. Take 6 tabs by mouth daily  for 2 days, then 5 tabs for 2 days, then 4 tabs for 2 days, then 3 tabs for 2 days, 2 tabs for 2 days, then 1 tab by mouth daily for 2 days 03/16/21  Yes Dola Lunsford, PA-C  ondansetron (ZOFRAN-ODT) 8 MG disintegrating tablet Take 1 tablet (8 mg total) by mouth every 8 (eight) hours as needed for nausea or vomiting. 08/06/19   Wallis Bamberg, PA-C  dicyclomine (BENTYL) 20 MG tablet Take 1 tablet (20 mg total) by mouth 2 (two) times daily. Patient not taking: Reported on 07/29/2019 12/07/15 08/06/19  Elpidio Anis, PA-C    Allergies    Patient has no known allergies.  Review of Systems   Review of Systems  Constitutional: Negative for chills and fever.  HENT: Negative for facial swelling.   Respiratory: Negative for shortness of breath.   Gastrointestinal: Negative for vomiting.  Skin: Positive for rash.  Neurological: Negative for weakness.    Physical Exam Updated Vital Signs BP 126/85 (BP Location: Right Arm)   Pulse 76   Temp 98.2 F (36.8 C) (Oral)   Resp 17   Ht 5' 8.5" (1.74 m)  Wt 81.6 kg   SpO2 100%   BMI 26.97 kg/m   Physical Exam Vitals and nursing note reviewed.  Constitutional:      General: He is not in acute distress.    Appearance: He is well-developed. He is not diaphoretic.  HENT:     Head: Normocephalic and atraumatic.  Eyes:     General: No scleral icterus.    Conjunctiva/sclera: Conjunctivae normal.  Pulmonary:     Effort: Pulmonary effort is normal. No respiratory distress.  Musculoskeletal:     Cervical back: Normal range of motion.  Skin:    Findings: Rash present.     Comments: Diffuse erythematous, dry rash noted throughout the anterior and posterior neck.  No vesicles, lesions or open wounds noted.  No drainage.  Neurological:      Mental Status: He is alert.     ED Results / Procedures / Treatments   Labs (all labs ordered are listed, but only abnormal results are displayed) Labs Reviewed - No data to display  EKG None  Radiology No results found.  Procedures Procedures   Medications Ordered in ED Medications - No data to display  ED Course  I have reviewed the triage vital signs and the nursing notes.  Pertinent labs & imaging results that were available during my care of the patient were reviewed by me and considered in my medical decision making (see chart for details).    MDM Rules/Calculators/A&P                          30 year old male presenting to the ED with a chief complaint of rash.  He started a new job with various new chemicals that he uses and is unsure if something has come in contact with his skin.  He reports rash to one side of his neck that he feels that has now spread despite using antibiotic ointment.  Denies any new environmental exposures other than at work.  Denies any signs of angioedema anaphylaxis.  On exam there is a papular rash noted throughout the entire neck that appears dry.  No temperature change, erythema or tenderness to palpation. Low suspicion for cellulitis, or severe reactions such as EM, SJS, TEN. He denies any trouble breathing, trouble swallowing or lip swelling that would suggest a severe allergic reaction. He denies any other symptoms at this time. He also denies any new skin contacts, outdoor exposure or sick contacts.  Unsure if this could be related to environmental irritants at work.  We will have him treat with steroid taper Dosepak.  Informed him that he should cover this area while at work to prevent subsequent episodes.  Return precautions given.  All imaging, if done today, including plain films, CT scans, and ultrasounds, independently reviewed by me, and interpretations confirmed via formal radiology reads.  Patient is hemodynamically stable, in  NAD, and able to ambulate in the ED. Evaluation does not show pathology that would require ongoing emergent intervention or inpatient treatment. I explained the diagnosis to the patient. Pain has been managed and has no complaints prior to discharge. Patient is comfortable with above plan and is stable for discharge at this time. All questions were answered prior to disposition. Strict return precautions for returning to the ED were discussed. Encouraged follow up with PCP.   An After Visit Summary was printed and given to the patient.   Portions of this note were generated with Scientist, clinical (histocompatibility and immunogenetics). Dictation errors may  occur despite best attempts at proofreading.  Final Clinical Impression(s) / ED Diagnoses Final diagnoses:  Rash and nonspecific skin eruption    Rx / DC Orders ED Discharge Orders         Ordered    predniSONE (STERAPRED UNI-PAK 21 TAB) 10 MG (21) TBPK tablet  Daily        03/16/21 2109           Dietrich Pates, PA-C 03/16/21 2110    Pollyann Savoy, MD 03/16/21 2217

## 2023-05-22 ENCOUNTER — Other Ambulatory Visit: Payer: Self-pay

## 2023-05-22 ENCOUNTER — Emergency Department (HOSPITAL_COMMUNITY): Payer: Self-pay

## 2023-05-22 ENCOUNTER — Encounter (HOSPITAL_COMMUNITY): Payer: Self-pay

## 2023-05-22 ENCOUNTER — Emergency Department (HOSPITAL_COMMUNITY)
Admission: EM | Admit: 2023-05-22 | Discharge: 2023-05-22 | Disposition: A | Discharge status: Home / Self Care | Payer: Self-pay | Attending: Emergency Medicine | Admitting: Emergency Medicine

## 2023-05-22 DIAGNOSIS — M25571 Pain in right ankle and joints of right foot: Secondary | ICD-10-CM | POA: Insufficient documentation

## 2023-05-22 MED ORDER — PREDNISONE 10 MG (21) PO TBPK: ORAL_TABLET | Freq: Every day | ORAL | 0 refills | Status: AC

## 2023-05-22 NOTE — Discharge Instructions (Addendum)
Please use immobilizer while walking. You are given a note for work as instructed not to be driving or walking at work for the next week Called the orthopedic referral for follow-up. There is no evidence of broken bones or other abnormality noted on your ankle x-Shane Gilbert

## 2023-05-22 NOTE — ED Provider Notes (Signed)
[place] EMERGENCY DEPARTMENT AT [facility] Provider Note   CSN: [id number] Arrival date & time: 05/22/23  1109     History {Add pertinent medical, surgical, social history, OB history to HPI:1} Chief Complaint  Patient presents with   Foot Pain    right    MONTI TASHJIAN is a 32 y.o. male.  HPI 32 yo male complaining of right ankle pain.  No injury noted.  Patient works for [employer] and was well until last week.  Pain began with pushing on gas and continues with plantar and dorsiflexion of right foot.  No direct trauma, redness, warmth or swelling. No prior similar episodes.    Home Medications Prior to Admission medications   Medication Sig Start Date End Date Taking? Authorizing Provider  ondansetron (ZOFRAN-ODT) 8 MG disintegrating tablet Take 1 tablet (8 mg total) by mouth every 8 (eight) hours as needed for nausea or vomiting. 08/06/19   Wallis Bamberg, PA-C  predniSONE (STERAPRED UNI-PAK 21 TAB) 10 MG (21) TBPK tablet Take by mouth daily. Take 6 tabs by mouth daily  for 2 days, then 5 tabs for 2 days, then 4 tabs for 2 days, then 3 tabs for 2 days, 2 tabs for 2 days, then 1 tab by mouth daily for 2 days 03/16/21   Idelle Leech, Hina, PA-C  dicyclomine (BENTYL) 20 MG tablet Take 1 tablet (20 mg total) by mouth 2 (two) times daily. Patient not taking: Reported on 07/29/2019 12/07/15 08/06/19  Elpidio Anis, PA-C      Allergies    Patient has no known allergies.    Review of Systems   Review of Systems  Physical Exam Updated Vital Signs BP (!) 134/96 (BP Location: Left Arm)   Pulse 87   Temp 97.8 F (36.6 C) (Oral)   Resp 16   Ht 1.727 m (5\' 8" )   Wt 72.6 kg   SpO2 98%   BMI 24.33 kg/m  Physical Exam Vitals reviewed.  Constitutional:      General: He is not in acute distress.    Appearance: Normal appearance. He is not ill-appearing.  HENT:     Head: Normocephalic.     Left Ear: External ear normal.     Nose: Nose normal.     Mouth/Throat:     Pharynx:  Oropharynx is clear.  Musculoskeletal:     Cervical back: Normal range of motion.     Comments: Right ankle visually inspected without signs of abnormality No swelling noted No ttp Pulses intact Skin normal  Skin:    General: Skin is warm.     Capillary Refill: Capillary refill takes less than 2 seconds.  Neurological:     Mental Status: He is alert.  Psychiatric:        Mood and Affect: Mood normal.        Behavior: Behavior normal.     ED Results / Procedures / Treatments   Labs (all labs ordered are listed, but only abnormal results are displayed) Labs Reviewed - No data to display  EKG None  Radiology No results found.  Procedures Procedures  {Document cardiac monitor, telemetry assessment procedure when appropriate:1}  Medications Ordered in ED Medications - No data to display  ED Course/ Medical Decision Making/ A&P   {   Click here for ABCD2, HEART and other calculatorsREFRESH Note before signing :1}  Medical Decision Making Amount and/or Complexity of Data Reviewed Radiology: ordered.   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  Patient has taken advil x 1 DDX includes but not limited to: Fracture Ankle sprain Tendinitis Gout  No inflammatory symptoms, doubt gout No direct trauma and no bony abnormality on x-Montgomery Rothlisberger  Plan immobilization Steroids F/u ortho Will place on no ambulation, immobilizer restraints for one week.   {Document critical care time when appropriate:1} {Document review of labs and clinical decision tools ie heart score, Chads2Vasc2 etc:1}  {Document your independent review of radiology images, and any outside records:1} {Document your discussion with family members, caretakers, and with consultants:1} {Document social determinants of health affecting pt's care:1} {Document your decision making why or why not admission, treatments were  needed:1} Final Clinical Impression(s) / ED Diagnoses Final diagnoses:  Acute right ankle pain    Rx / DC Orders ED Discharge Orders     None

## 2023-05-22 NOTE — ED Triage Notes (Signed)
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

## 2023-05-25 ENCOUNTER — Ambulatory Visit (INDEPENDENT_AMBULATORY_CARE_PROVIDER_SITE_OTHER): Payer: Self-pay | Admitting: Podiatry

## 2023-05-25 DIAGNOSIS — Z91199 Patient's noncompliance with other medical treatment and regimen due to unspecified reason: Secondary | ICD-10-CM

## 2023-05-26 NOTE — Progress Notes (Signed)
Patient was no-show for appointment today
